# Patient Record
Sex: Male | Born: 1985 | Race: White | Hispanic: No | Marital: Single | Smoking: Former smoker
Health system: Southern US, Community
[De-identification: ages and names within clinical notes are randomized; demographics above are authoritative.]

## PROBLEM LIST (undated history)

## (undated) ENCOUNTER — Ambulatory Visit (HOSPITAL_COMMUNITY): Admission: EM | Disposition: A | Discharge status: Other Acute Inpatient Hospital | Payer: PRIVATE HEALTH INSURANCE

## (undated) DIAGNOSIS — I10 Essential (primary) hypertension: Secondary | ICD-10-CM

## (undated) DIAGNOSIS — J40 Bronchitis, not specified as acute or chronic: Secondary | ICD-10-CM

## (undated) HISTORY — PX: BACK SURGERY: SHX140

## (undated) HISTORY — PX: KNEE SURGERY: SHX244

---

## 2008-08-15 ENCOUNTER — Emergency Department: Payer: Self-pay | Admitting: Internal Medicine

## 2009-04-26 ENCOUNTER — Ambulatory Visit: Payer: Self-pay

## 2010-03-14 ENCOUNTER — Emergency Department (HOSPITAL_COMMUNITY): Admission: EM | Admit: 2010-03-14 | Discharge: 2010-03-15 | Payer: Self-pay | Admitting: Emergency Medicine

## 2010-08-04 ENCOUNTER — Emergency Department (HOSPITAL_COMMUNITY): Admission: EM | Admit: 2010-08-04 | Discharge: 2010-08-05 | Payer: Self-pay | Admitting: Emergency Medicine

## 2010-08-30 ENCOUNTER — Emergency Department (HOSPITAL_COMMUNITY): Admission: EM | Admit: 2010-08-30 | Discharge: 2010-05-30 | Payer: Self-pay | Admitting: Emergency Medicine

## 2010-09-24 ENCOUNTER — Ambulatory Visit: Payer: Self-pay | Admitting: Internal Medicine

## 2010-10-08 ENCOUNTER — Emergency Department (HOSPITAL_COMMUNITY)
Admission: EM | Admit: 2010-10-08 | Discharge: 2010-10-08 | Payer: Self-pay | Source: Home / Self Care | Admitting: Emergency Medicine

## 2010-10-13 ENCOUNTER — Ambulatory Visit: Payer: Self-pay | Admitting: Internal Medicine

## 2010-10-13 ENCOUNTER — Emergency Department: Payer: Self-pay | Admitting: Emergency Medicine

## 2010-10-16 ENCOUNTER — Emergency Department (HOSPITAL_COMMUNITY)
Admission: EM | Admit: 2010-10-16 | Discharge: 2010-10-17 | Payer: Self-pay | Source: Home / Self Care | Admitting: Emergency Medicine

## 2010-10-17 LAB — CBC
MCV: 90.9 fL (ref 78.0–100.0)
Platelets: 226 10*3/uL (ref 150–400)
RBC: 4.83 MIL/uL (ref 4.22–5.81)
RDW: 13 % (ref 11.5–15.5)
WBC: 6.9 10*3/uL (ref 4.0–10.5)

## 2010-10-17 LAB — POCT I-STAT, CHEM 8
Calcium, Ion: 1.07 mmol/L — ABNORMAL LOW (ref 1.12–1.32)
Glucose, Bld: 84 mg/dL (ref 70–99)
HCT: 45 % (ref 39.0–52.0)
Hemoglobin: 15.3 g/dL (ref 13.0–17.0)
TCO2: 27 mmol/L (ref 0–100)

## 2010-10-17 LAB — DIFFERENTIAL
Basophils Absolute: 0 10*3/uL (ref 0.0–0.1)
Basophils Relative: 1 % (ref 0–1)
Eosinophils Absolute: 0.3 10*3/uL (ref 0.0–0.7)
Eosinophils Relative: 4 % (ref 0–5)
Neutrophils Relative %: 44 % (ref 43–77)

## 2010-11-04 ENCOUNTER — Emergency Department (HOSPITAL_COMMUNITY): Payer: Medicare Other

## 2010-11-04 ENCOUNTER — Emergency Department (HOSPITAL_COMMUNITY)
Admission: EM | Admit: 2010-11-04 | Discharge: 2010-11-04 | Disposition: A | Discharge status: Home / Self Care | Payer: Medicare Other | Attending: Emergency Medicine | Admitting: Emergency Medicine

## 2010-11-04 DIAGNOSIS — R079 Chest pain, unspecified: Secondary | ICD-10-CM | POA: Insufficient documentation

## 2010-11-04 DIAGNOSIS — G8929 Other chronic pain: Secondary | ICD-10-CM | POA: Insufficient documentation

## 2010-11-04 DIAGNOSIS — M549 Dorsalgia, unspecified: Secondary | ICD-10-CM | POA: Insufficient documentation

## 2010-11-11 ENCOUNTER — Emergency Department (HOSPITAL_COMMUNITY)
Admission: EM | Admit: 2010-11-11 | Discharge: 2010-11-11 | Disposition: A | Discharge status: Home / Self Care | Payer: Medicare Other | Attending: Emergency Medicine | Admitting: Emergency Medicine

## 2010-11-11 ENCOUNTER — Emergency Department (HOSPITAL_COMMUNITY): Payer: Medicare Other

## 2010-11-11 DIAGNOSIS — M722 Plantar fascial fibromatosis: Secondary | ICD-10-CM | POA: Insufficient documentation

## 2010-11-11 DIAGNOSIS — M79609 Pain in unspecified limb: Secondary | ICD-10-CM | POA: Insufficient documentation

## 2010-11-12 ENCOUNTER — Emergency Department (HOSPITAL_COMMUNITY)
Admission: EM | Admit: 2010-11-12 | Discharge: 2010-11-12 | Disposition: A | Discharge status: Home / Self Care | Payer: Medicare Other | Attending: Emergency Medicine | Admitting: Emergency Medicine

## 2010-11-12 DIAGNOSIS — G8929 Other chronic pain: Secondary | ICD-10-CM | POA: Insufficient documentation

## 2010-11-12 DIAGNOSIS — M722 Plantar fascial fibromatosis: Secondary | ICD-10-CM | POA: Insufficient documentation

## 2010-11-12 DIAGNOSIS — M549 Dorsalgia, unspecified: Secondary | ICD-10-CM | POA: Insufficient documentation

## 2010-11-12 DIAGNOSIS — M79609 Pain in unspecified limb: Secondary | ICD-10-CM | POA: Insufficient documentation

## 2010-11-15 ENCOUNTER — Ambulatory Visit: Payer: Medicare Other

## 2010-11-22 ENCOUNTER — Emergency Department (HOSPITAL_COMMUNITY)
Admission: EM | Admit: 2010-11-22 | Discharge: 2010-11-23 | Disposition: A | Discharge status: Other Acute Inpatient Hospital | Payer: Medicare Other | Source: Home / Self Care | Attending: Emergency Medicine | Admitting: Emergency Medicine

## 2010-11-22 DIAGNOSIS — F1110 Opioid abuse, uncomplicated: Secondary | ICD-10-CM | POA: Insufficient documentation

## 2010-11-22 DIAGNOSIS — F329 Major depressive disorder, single episode, unspecified: Secondary | ICD-10-CM | POA: Insufficient documentation

## 2010-11-22 DIAGNOSIS — F3289 Other specified depressive episodes: Secondary | ICD-10-CM | POA: Insufficient documentation

## 2010-11-22 DIAGNOSIS — M79609 Pain in unspecified limb: Secondary | ICD-10-CM | POA: Insufficient documentation

## 2010-11-22 LAB — DIFFERENTIAL
Basophils Absolute: 0 10*3/uL (ref 0.0–0.1)
Basophils Relative: 0 % (ref 0–1)
Eosinophils Absolute: 0.3 K/uL (ref 0.0–0.7)
Eosinophils Relative: 2 % (ref 0–5)
Lymphocytes Relative: 26 % (ref 12–46)
Lymphs Abs: 3.2 K/uL (ref 0.7–4.0)
Monocytes Absolute: 1.3 K/uL — ABNORMAL HIGH (ref 0.1–1.0)
Monocytes Relative: 11 % (ref 3–12)
Neutro Abs: 7.7 10*3/uL (ref 1.7–7.7)
Neutrophils Relative %: 61 % (ref 43–77)

## 2010-11-22 LAB — COMPREHENSIVE METABOLIC PANEL
ALT: 25 U/L (ref 0–53)
Albumin: 4.3 g/dL (ref 3.5–5.2)
Alkaline Phosphatase: 56 U/L (ref 39–117)
Chloride: 103 mEq/L (ref 96–112)
Glucose, Bld: 92 mg/dL (ref 70–99)
Potassium: 3.5 mEq/L (ref 3.5–5.1)
Sodium: 138 mEq/L (ref 135–145)
Total Protein: 7.6 g/dL (ref 6.0–8.3)

## 2010-11-22 LAB — CBC
HCT: 45.9 % (ref 39.0–52.0)
Hemoglobin: 16.3 g/dL (ref 13.0–17.0)
MCH: 32 pg (ref 26.0–34.0)
MCHC: 35.5 g/dL (ref 30.0–36.0)
MCV: 90 fL (ref 78.0–100.0)
Platelets: 196 10*3/uL (ref 150–400)
RBC: 5.1 MIL/uL (ref 4.22–5.81)
RDW: 12.9 % (ref 11.5–15.5)
WBC: 12.6 10*3/uL — ABNORMAL HIGH (ref 4.0–10.5)

## 2010-11-22 LAB — ETHANOL: Alcohol, Ethyl (B): <5 mg/dL (ref 0–10)

## 2010-11-22 LAB — RAPID URINE DRUG SCREEN, HOSP PERFORMED
Amphetamines: NOT DETECTED
Barbiturates: NOT DETECTED
Benzodiazepines: NOT DETECTED
Cocaine: NOT DETECTED
Opiates: POSITIVE — AB
Tetrahydrocannabinol: NOT DETECTED

## 2010-11-22 LAB — COMPREHENSIVE METABOLIC PANEL WITH GFR
AST: 27 U/L (ref 0–37)
BUN: 18 mg/dL (ref 6–23)
CO2: 26 meq/L (ref 19–32)
Calcium: 9.2 mg/dL (ref 8.4–10.5)
Creatinine, Ser: 0.98 mg/dL (ref 0.4–1.5)
GFR calc Af Amer: >60 mL/min (ref >60)
GFR calc non Af Amer: >60 mL/min (ref >60)
Total Bilirubin: 1.1 mg/dL (ref 0.3–1.2)

## 2010-11-23 ENCOUNTER — Inpatient Hospital Stay (HOSPITAL_COMMUNITY)
Admission: AD | Admit: 2010-11-23 | Discharge: 2010-11-26 | DRG: 897 | Disposition: A | Discharge status: Home / Self Care | Payer: Medicare Other | Source: Ambulatory Visit | Attending: Psychiatry | Admitting: Psychiatry

## 2010-11-23 DIAGNOSIS — M549 Dorsalgia, unspecified: Secondary | ICD-10-CM

## 2010-11-23 DIAGNOSIS — Z6379 Other stressful life events affecting family and household: Secondary | ICD-10-CM

## 2010-11-23 DIAGNOSIS — F1120 Opioid dependence, uncomplicated: Principal | ICD-10-CM

## 2010-11-23 DIAGNOSIS — F329 Major depressive disorder, single episode, unspecified: Secondary | ICD-10-CM

## 2010-11-23 DIAGNOSIS — G8929 Other chronic pain: Secondary | ICD-10-CM

## 2010-11-23 DIAGNOSIS — F19939 Other psychoactive substance use, unspecified with withdrawal, unspecified: Secondary | ICD-10-CM

## 2010-11-23 DIAGNOSIS — K5900 Constipation, unspecified: Secondary | ICD-10-CM

## 2010-11-24 DIAGNOSIS — F1120 Opioid dependence, uncomplicated: Secondary | ICD-10-CM

## 2010-11-26 NOTE — H&P (Signed)
NAMESEBASTYAN, Cody Powell NO.:  [id number]  MEDICAL RECORD NO.:  [id number]           PATIENT TYPE:  I  LOCATION:  0306                          FACILITY:  [facility]  PHYSICIAN:  Anselm Jungling, MD  DATE OF BIRTH:  March 19, 1986  DATE OF ADMISSION:  11/23/2010 DATE OF DISCHARGE:                      PSYCHIATRIC ADMISSION ASSESSMENT   This is a voluntary admission to the services of Dr. Geralyn Flash.  This is 25 year old morbidly obese single white male.  He presented to the emergency department on March 1.  He stated that he needs to be detoxed from hydrocodone, oxycodone, and heroin.  His last use had been that morning.  His UDS was positive for opiates.  He had no measurable alcohol.  He has been using narcotics as well as IV heroin recently due to chronic back pain.  He is facing eviction.  He has a court date actually on Monday, which would be the 5th, regarding eviction.  He stated that he realizes that his girlfriend and 14-month-old son need him to work; hence, he presented for detox.  He has had withdrawal symptoms when off of drugs for about a day or day and a half.  He had abdominal cramps, sharp pain, nausea, and shaky.  He did shoot up into his left hand.  It did not go into the vein.  It was quite swollen up until yesterday.  It is improved, was still painful but not red.  Today it is a little bit red, but it does not appear to have cellulitis.  PAST PSYCHIATRIC HISTORY:  He began having treatment for substance abuse at age 62.  He has been to a facility [facility] in [place] 5 times.  He has been to [facility], [facility], and [facility].  SOCIAL HISTORY:  He has 1 year of college.  He has a 24-month-old son. He receives disability for his back and knee.  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  FAMILY HISTORY:  His brother had a substance abuse issue.  ALCOHOL/DRUG HISTORY:  He began using alcohol at age 23 or  25, hydrocodone at age 89, and heroin at age 78.  He currently has no primary care provider.  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  He underwent in December 2008 an L4-L5, L5-S1 diskectomy at [facility] in [place]. Two months ago, he underwent a left knee menisci.  Also his patellar was relocated and some work on the ACL and bone fragments were removed from his left knee.  This is at [facility].  CURRENTLY PRESCRIBED MEDICATIONS:  Flexeril 10 mg p.o. p.r.n. q.12h.  DRUG ALLERGIES:  NO KNOWN DRUG ALLERGIES.  Positive physical findings have already been commented on.  His urine was positive for opiates.  His alcohol level was less than 5. His WBC was slightly elevated at 12.6, and he had no abnormalities of his CMET.  PHYSICAL FINDINGS:  Vital signs showed that he is afebrile at 97.4 to 99.4.  His blood pressure ranged from 113/82 to 158/97.  Pulse was 76- 92, and respirations were 16-20. He is a large  young man.  His left hand is red, but he has full motion. There is no indication for cellulitis, just local irritation from reaction to where he missed the vein.  MENTAL STATUS EXAM:  He is alert and oriented.  He had good eye contact. He is casually groomed and dressed in hospital gowns.  His speech had a normal rate, rhythm, and tone.  His mood is appropriate to the situation.  Thought processes are clear, rational, and goal-oriented. Judgment and insight appear to be intact.  Concentration and memory are intact.  Intelligence is at least average.  PHYSICAL EXAMINATION:  AXIS I: 1. Opioid dependence, here for withdrawal. 2. Numerous prior psychiatric diagnoses by history, although he does     hook them to his drug use.  AXIS II:  Deferred.  AXIS III:  Chronic back pain, status post surgery for herniated disk.  AXIS IV:  Financial, occupational, relationship stressors.  AXIS V: 1.  The plan is to admit for a supported opiate detox through the  use of the clonidine detox protocol.  He denies any need for antidepressants, and he is asking [place] to help him get a letter to the court on Monday since he will not be there for his eviction hearing.  Estimated length of stay is until Tuesday, as he is hoping to be able to return to work on Wednesday March 7.     [name], P.A.-C.   ______________________________ Anselm Jungling, MD    MD/MEDQ  D:  11/24/2010  T:  11/24/2010  Job:  [id number]  Electronically Signed by Jaci Lazier ADAMS P.A.-C. on 11/25/2010 02:00:51 PM Electronically Signed by Geralyn Flash MD on 11/26/2010 11:06:46 AM

## 2010-12-04 LAB — COMPREHENSIVE METABOLIC PANEL
AST: 25 U/L (ref 0–37)
Albumin: 4.5 g/dL (ref 3.5–5.2)
Alkaline Phosphatase: 60 U/L (ref 39–117)
Chloride: 101 mEq/L (ref 96–112)
GFR calc Af Amer: >60 mL/min (ref >60)
Potassium: 3.9 mEq/L (ref 3.5–5.1)
Sodium: 135 mEq/L (ref 135–145)
Total Bilirubin: 0.7 mg/dL (ref 0.3–1.2)

## 2010-12-04 LAB — CBC
Platelets: 212 10*3/uL (ref 150–400)
RBC: 5.06 MIL/uL (ref 4.22–5.81)
WBC: 7.5 10*3/uL (ref 4.0–10.5)

## 2010-12-04 LAB — DIFFERENTIAL
Basophils Absolute: 0 10*3/uL (ref 0.0–0.1)
Basophils Relative: 0 % (ref 0–1)
Eosinophils Relative: 4 % (ref 0–5)
Lymphocytes Relative: 33 % (ref 12–46)
Monocytes Absolute: 0.5 10*3/uL (ref 0.1–1.0)

## 2010-12-12 NOTE — Discharge Summary (Signed)
  NAMEBREWER, HITCHMAN NO.:  [id number]  MEDICAL RECORD NO.:  [id number]           PATIENT TYPE:  I  LOCATION:  0306                          FACILITY:  [facility]  PHYSICIAN:  Anselm Jungling, MD  DATE OF BIRTH:  1985/10/17  DATE OF ADMISSION:  11/23/2010 DATE OF DISCHARGE:  11/26/2010                              DISCHARGE SUMMARY   IDENTIFYING DATA AND REASON FOR ADMISSION:  This was an inpatient psychiatric admission for Cody Powell, a 25 year old male who presented requesting detoxification from addiction to opiate pain medication and heroin.  Please refer to the admission note for further details pertaining to the symptoms, circumstances and history that led to his hospitalization.  He was given an initial Axis I diagnosis of opiate dependence.  MEDICAL AND LABORATORY:  The patient was medically and physically assessed by the psychiatric nurse practitioner.  He was in generally good health, without any active or chronic medical problems.  There were no significant medical issues during his stay.  HOSPITAL COURSE:  The patient was admitted to the adult inpatient psychiatric service.  He presented as a well-nourished, obese male who was clear and coherent in speech and thinking.  He denied suicidal ideation.  He verbalized a strong desire for help.  He was involved in therapeutic groups and activities including those geared towards 12-step recovery.  He was placed on the clonidine taper and standard opiate withdrawal protocol.  On the fourth hospital day, the patient indicated that he felt ready for discharge.  He stated that he needed to leave because of multiple issues to deal with regarding his job, housing, and legal.  He clearly and convincingly denied any suicidal ideation, plan or intent.  He agreed to the following aftercare plan.  AFTERCARE:  The patient was to follow up with Alcoholics Anonymous, attend 90 meetings in 90 days, and get a  sponsor.  DISCHARGE MEDICATIONS:  None.  DISCHARGE DIAGNOSES:  Axis I:  Opiate dependence, early remission. Axis II:  Deferred. Axis III:  No acute or chronic illnesses. Axis IV:  Stressors severe. Axis V:  GAF on discharge 50.     Anselm Jungling, MD     SPB/MEDQ  D:  12/12/2010  T:  12/12/2010  Job:  [id number]  Electronically Signed by Geralyn Flash MD on 12/12/2010 01:51:59 PM

## 2011-12-19 ENCOUNTER — Emergency Department (HOSPITAL_COMMUNITY)
Admission: EM | Admit: 2011-12-19 | Discharge: 2011-12-19 | Disposition: A | Discharge status: Home / Self Care | Payer: Medicare Other | Attending: Emergency Medicine | Admitting: Emergency Medicine

## 2011-12-19 ENCOUNTER — Emergency Department (HOSPITAL_COMMUNITY): Payer: Medicare Other

## 2011-12-19 ENCOUNTER — Encounter (HOSPITAL_COMMUNITY): Payer: Self-pay | Admitting: Emergency Medicine

## 2011-12-19 DIAGNOSIS — F17200 Nicotine dependence, unspecified, uncomplicated: Secondary | ICD-10-CM | POA: Insufficient documentation

## 2011-12-19 DIAGNOSIS — J3489 Other specified disorders of nose and nasal sinuses: Secondary | ICD-10-CM | POA: Insufficient documentation

## 2011-12-19 DIAGNOSIS — J029 Acute pharyngitis, unspecified: Secondary | ICD-10-CM | POA: Insufficient documentation

## 2011-12-19 DIAGNOSIS — J302 Other seasonal allergic rhinitis: Secondary | ICD-10-CM

## 2011-12-19 DIAGNOSIS — J309 Allergic rhinitis, unspecified: Secondary | ICD-10-CM | POA: Insufficient documentation

## 2011-12-19 DIAGNOSIS — R059 Cough, unspecified: Secondary | ICD-10-CM | POA: Insufficient documentation

## 2011-12-19 DIAGNOSIS — R05 Cough: Secondary | ICD-10-CM | POA: Insufficient documentation

## 2011-12-19 DIAGNOSIS — R6889 Other general symptoms and signs: Secondary | ICD-10-CM | POA: Insufficient documentation

## 2011-12-19 HISTORY — DX: Bronchitis, not specified as acute or chronic: J40

## 2011-12-19 MED ORDER — LORATADINE 10 MG PO TABS
10.0000 mg | ORAL_TABLET | Freq: Once | ORAL | Status: AC
  Administered 2011-12-19: 10 mg via ORAL
  Filled 2011-12-19 (×2): qty 1

## 2011-12-19 MED ORDER — ALBUTEROL SULFATE HFA 108 (90 BASE) MCG/ACT IN AERS: 2.0000 | INHALATION_SPRAY | RESPIRATORY_TRACT | Status: AC | PRN

## 2011-12-19 MED ORDER — ALBUTEROL SULFATE HFA 108 (90 BASE) MCG/ACT IN AERS
2.0000 | INHALATION_SPRAY | Freq: Once | RESPIRATORY_TRACT | Status: AC
  Administered 2011-12-19: 2 via RESPIRATORY_TRACT
  Filled 2011-12-19: qty 6.7

## 2011-12-19 MED ORDER — LORATADINE 10 MG PO TABS: 10.0000 mg | ORAL_TABLET | Freq: Once | ORAL | Status: AC

## 2011-12-19 NOTE — ED Notes (Signed)
Pt states he has had a cough for the past couple days and this evening he started having an aching in his chest but now it hurts when he coughs  Pt states his throat feels raw from all the coughing   Pt states he has been sick 3 times in the past couple months  Pt has dry hacking cough noted in triage

## 2011-12-19 NOTE — ED Provider Notes (Signed)
Medical screening examination/treatment/procedure(s) were performed by non-physician practitioner and as supervising physician I was immediately available for consultation/collaboration.  Jasmine Awe, MD 12/19/11 [phone]

## 2011-12-19 NOTE — ED Provider Notes (Signed)
History     CSN: [id number]  Arrival date & time 12/19/11  0020   First MD Initiated Contact with Patient 12/19/11 0221      Chief Complaint  Patient presents with  . Cough    (Consider location/radiation/quality/duration/timing/severity/associated sxs/prior treatment) HPI Comments: Patient here with a three day history of cough without sputum production, runny nose, nasal congestion, watery eyes, sneezing and sore throat - denies fever, chills, shortness of breath, wheezing, nausea, vomiting, chest pain.  Patient is a 26 y.o. male presenting with cough. The history is provided by the patient. No language interpreter was used.  Cough This is a new problem. The current episode started more than 2 days ago. The problem occurs constantly. The problem has not changed since onset.The cough is non-productive. There has been no fever. Associated symptoms include rhinorrhea, sore throat and eye redness. Pertinent negatives include no chest pain, no chills, no sweats, no weight loss, no ear congestion, no ear pain, no headaches, no myalgias, no shortness of breath and no wheezing. He has tried nothing for the symptoms. The treatment provided no relief. He is a smoker.    Past Medical History  Diagnosis Date  . Bronchitis     Past Surgical History  Procedure Date  . Back surgery     Family History  Problem Relation Age of Onset  . Cancer Other     History  Substance Use Topics  . Smoking status: Current Everyday Smoker    Types: Cigarettes  . Smokeless tobacco: Not on file  . Alcohol Use: No      Review of Systems  Constitutional: Negative for chills and weight loss.  HENT: Positive for sore throat and rhinorrhea. Negative for ear pain.   Eyes: Positive for redness.  Respiratory: Positive for cough. Negative for shortness of breath and wheezing.   Cardiovascular: Negative for chest pain.  Musculoskeletal: Negative for myalgias.  Neurological: Negative for headaches.  All  other systems reviewed and are negative.    Allergies  Review of patient's allergies indicates no known allergies.  Home Medications  No current outpatient prescriptions on file.  BP 130/73  Pulse 89  Temp(Src) 98.1 F (36.7 C) (Oral)  Resp 22  SpO2 96%  Physical Exam  Nursing note and vitals reviewed. Constitutional: He is oriented to person, place, and time. He appears well-developed and well-nourished. No distress.  HENT:  Head: Normocephalic and atraumatic.  Right Ear: External ear normal.  Left Ear: External ear normal.  Mouth/Throat: No oropharyngeal exudate.       Rhinorrhea and mild erythema in posterior oropharynx  Eyes: Conjunctivae are normal. Pupils are equal, round, and reactive to light. Right eye exhibits no discharge. Left eye exhibits no discharge. No scleral icterus.  Neck: Normal range of motion. Neck supple. No thyromegaly present.  Cardiovascular: Normal rate, regular rhythm and normal heart sounds.  Exam reveals no gallop and no friction rub.   No murmur heard. Pulmonary/Chest: Effort normal and breath sounds normal. No respiratory distress. He has no wheezes. He has no rales. He exhibits no tenderness.  Abdominal: Soft. Bowel sounds are normal. He exhibits no distension. There is no tenderness.  Musculoskeletal: Normal range of motion. He exhibits no edema and no tenderness.  Lymphadenopathy:    He has no cervical adenopathy.  Neurological: He is alert and oriented to person, place, and time. No cranial nerve deficit.  Skin: Skin is warm and dry. No rash noted. No erythema. No pallor.  Psychiatric: He has  a normal mood and affect. His behavior is normal. Judgment and thought content normal.    ED Course  Procedures (including critical care time)  Labs Reviewed - No data to display Dg Chest 2 View  12/19/2011  *RADIOLOGY REPORT*  Clinical Data: Cough for 3 days; chest pain.  CHEST - 2 VIEW  Comparison: Chest radiograph performed 11/04/2010  Findings:  The lungs are well-aerated and clear.  There is no evidence of focal opacification, pleural effusion or pneumothorax.  The heart is normal in size; the mediastinal contour is within normal limits.  No acute osseous abnormalities are seen.  IMPRESSION: No acute cardiopulmonary process seen.  Original Report Authenticated By: Tonia Ghent, M.D.     Seasonal allergies   MDM  Patient here with dry and hacking cough, watery eyes, runny nose without productive cough, though he is not wheezing, I have given him an inhaler here to help with the cough which I believe more to be related to the allergy symptoms and started him on claritin for this.        Izola Price [place], [place] 12/19/11 [phone]

## 2011-12-19 NOTE — Discharge Instructions (Signed)
Cough, Adult  A cough is a reflex. It helps you clear your throat and airways. A cough can help heal your body. A cough can last 2 or 3 weeks (acute) or may last more than 8 weeks (chronic). Some common causes of a cough can include an infection, allergy, or a cold. HOME CARE  Only take medicine as told by your doctor.   If given, take your medicines (antibiotics) as told. Finish them even if you start to feel better.   Use a cold steam vaporizer or humidier in your home. This can help loosen thick spit (secretions).   Sleep so you are almost sitting up (semi-upright). Use pillows to do this. This helps reduce coughing.   Rest as needed.   Stop smoking if you smoke.  GET HELP RIGHT AWAY IF:  You have yellowish-white fluid (pus) in your thick spit.   Your cough gets worse.   Your medicine does not reduce coughing, and you are losing sleep.   You cough up blood.   You have trouble breathing.   Your pain gets worse and medicine does not help.   You have a fever.  MAKE SURE YOU:   Understand these instructions.   Will watch your condition.   Will get help right away if you are not doing well or get worse.  Document Released: 05/23/2011 Document Revised: 08/29/2011 Document Reviewed: 05/23/2011 [facility] Patient Information 2012 [place], [place].Allergic Rhinitis Allergic rhinitis is when the mucous membranes in the nose respond to allergens. Allergens are particles in the air that cause your body to have an allergic reaction. This causes you to release allergic antibodies. Through a chain of events, these eventually cause you to release histamine into the blood stream (hence the use of antihistamines). Although meant to be protective to the body, it is this release that causes your discomfort, such as frequent sneezing, congestion and an itchy runny nose.  CAUSES  The pollen allergens may come from grasses, trees, and weeds. This is seasonal allergic rhinitis, or "hay fever." Other  allergens cause year-round allergic rhinitis (perennial allergic rhinitis) such as house dust mite allergen, pet dander and mold spores.  SYMPTOMS   Nasal stuffiness (congestion).   Runny, itchy nose with sneezing and tearing of the eyes.   There is often an itching of the mouth, eyes and ears.  It cannot be cured, but it can be controlled with medications. DIAGNOSIS  If you are unable to determine the offending allergen, skin or blood testing may find it. TREATMENT   Avoid the allergen.   Medications and allergy shots (immunotherapy) can help.   Hay fever may often be treated with antihistamines in pill or nasal spray forms. Antihistamines block the effects of histamine. There are over-the-counter medicines that may help with nasal congestion and swelling around the eyes. Check with your caregiver before taking or giving this medicine.  If the treatment above does not work, there are many new medications your caregiver can prescribe. Stronger medications may be used if initial measures are ineffective. Desensitizing injections can be used if medications and avoidance fails. Desensitization is when a patient is given ongoing shots until the body becomes less sensitive to the allergen. Make sure you follow up with your caregiver if problems continue. SEEK MEDICAL CARE IF:   You develop fever (more than 100.5 F (38.1 C).   You develop a cough that does not stop easily (persistent).   You have shortness of breath.   You start wheezing.  Symptoms interfere with normal daily activities.  Document Released: 06/04/2001 Document Revised: 08/29/2011 Document Reviewed: 12/14/2008 [facility] Patient Information 2012 [place], [place].

## 2012-03-03 IMAGING — CR DG THORACIC SPINE 2V
3 series · 3 of 3 positions shown · non-contrast
Comparison: Chest 11/04/2010.

CLINICAL DATA: Chest pain.

THORACIC SPINE - 2 VIEW

[t t-spine a.p.]
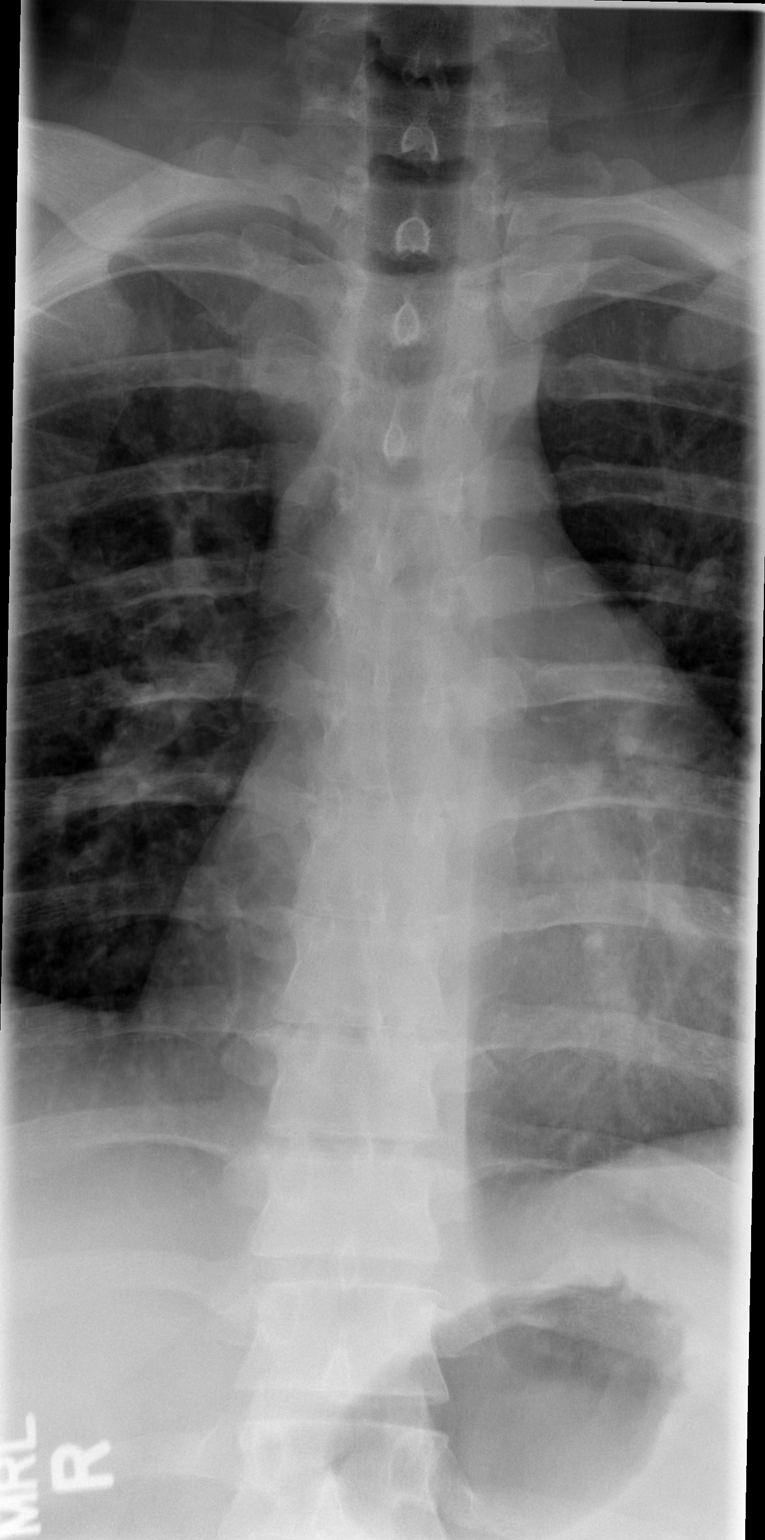

[t t-spine lat *]
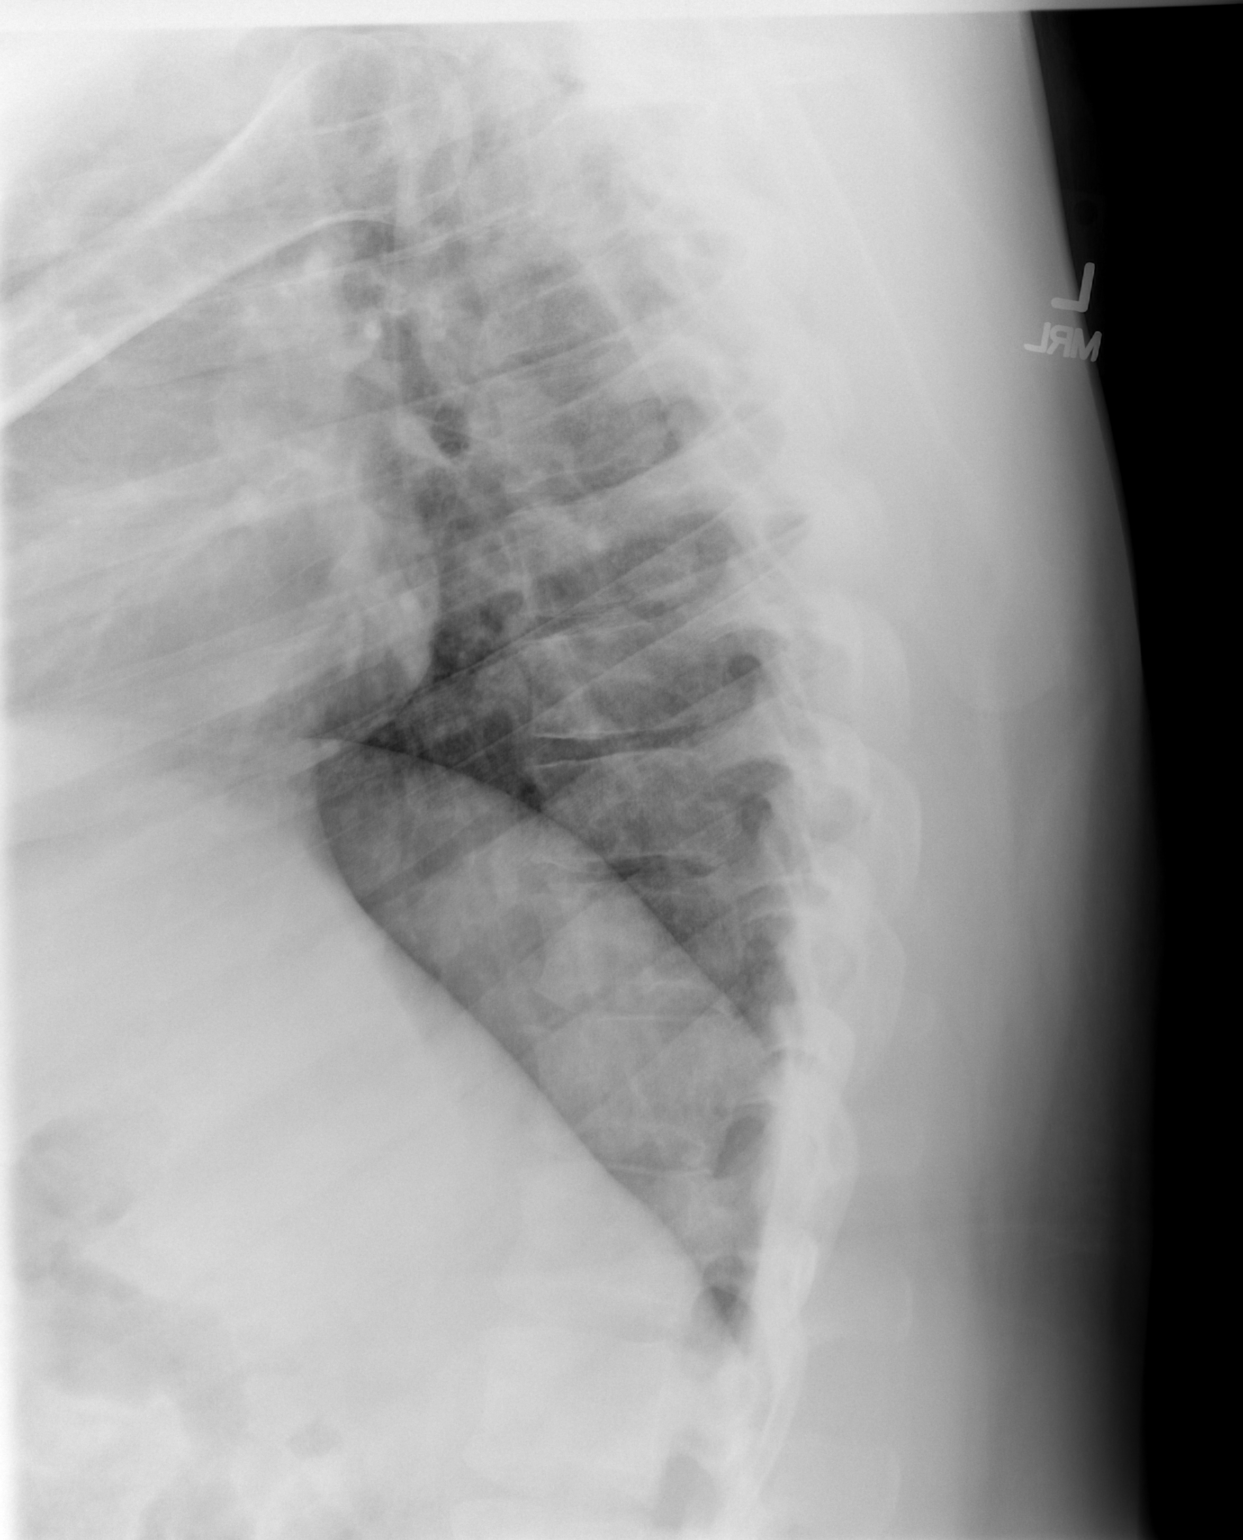

[t swimmers *]
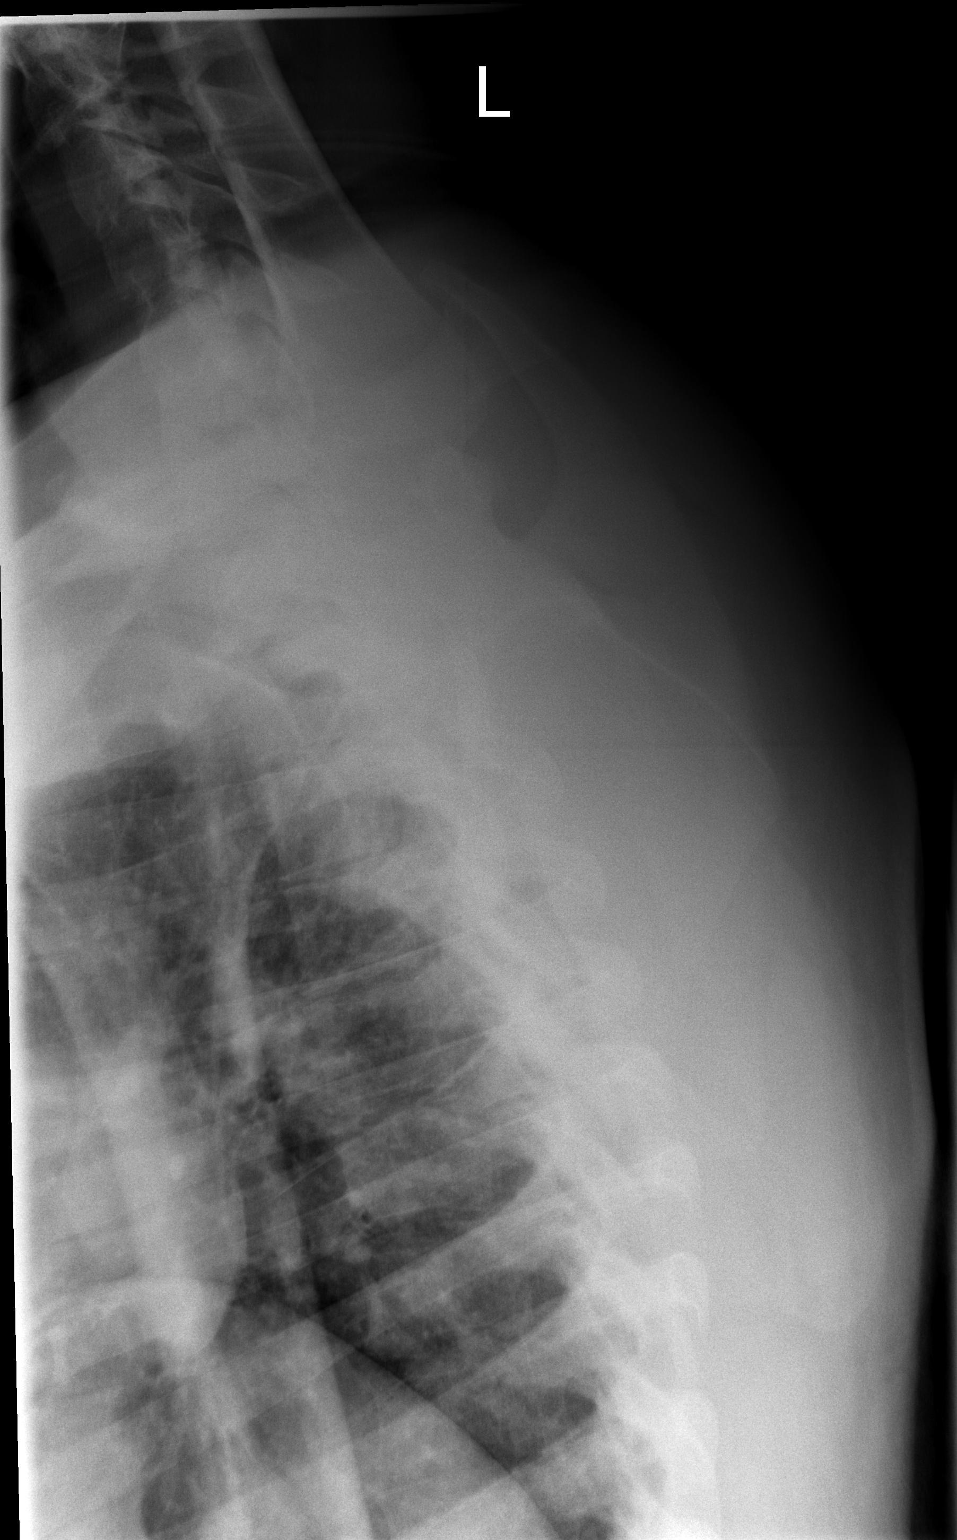

[3 of 3 positions shown; findings below may reference images not displayed]

FINDINGS: Vertebral body height and alignment are normal.  No
degenerative change.  Paraspinous structures unremarkable.
IMPRESSION: Negative exam.

## 2012-03-03 IMAGING — CR DG CHEST 2V
2 series · 2 of 2 positions shown · non-contrast
Comparison: Chest 08/04/2010.

CLINICAL DATA: Chest pain.

CHEST - 2 VIEW

[w chest pa]
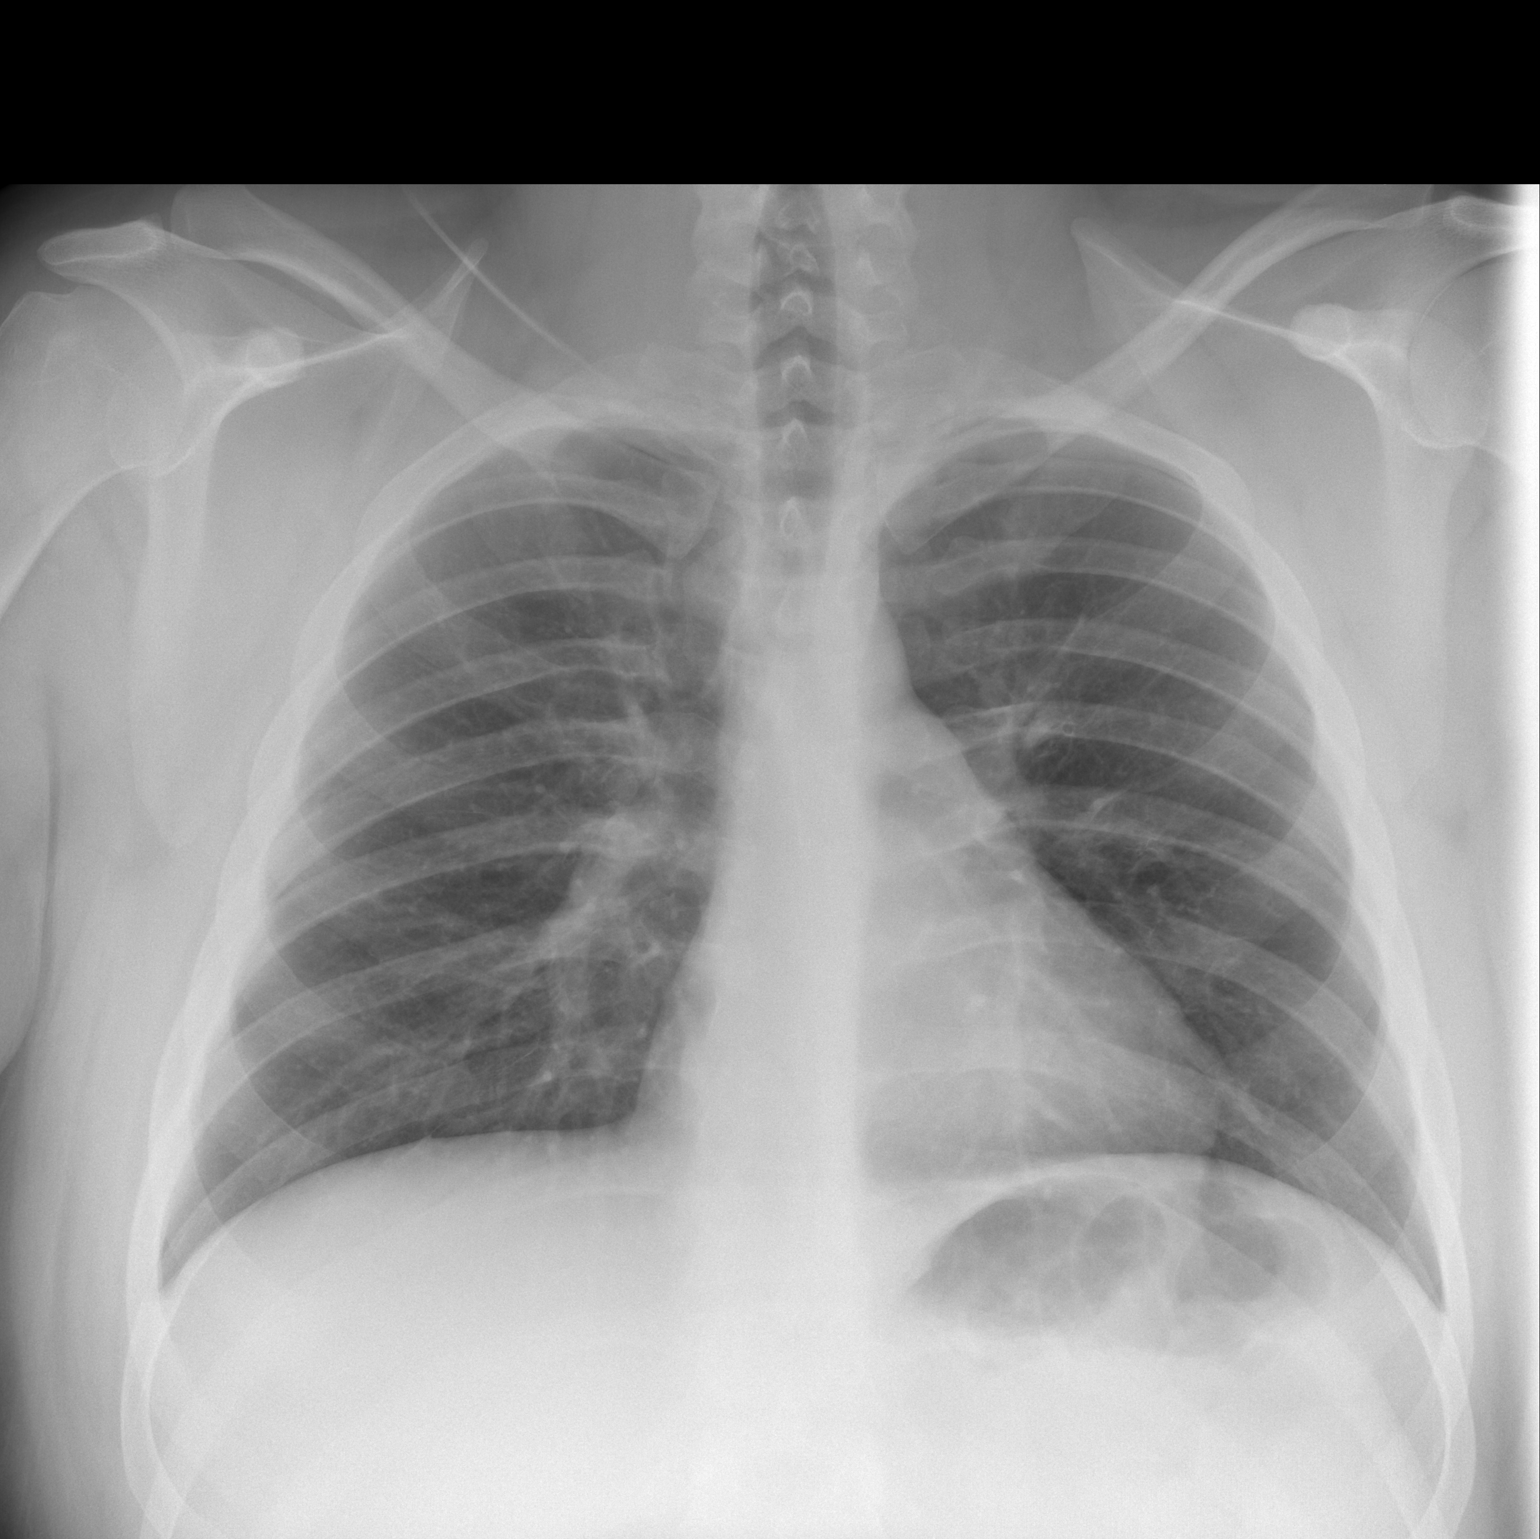

[w chest lat]
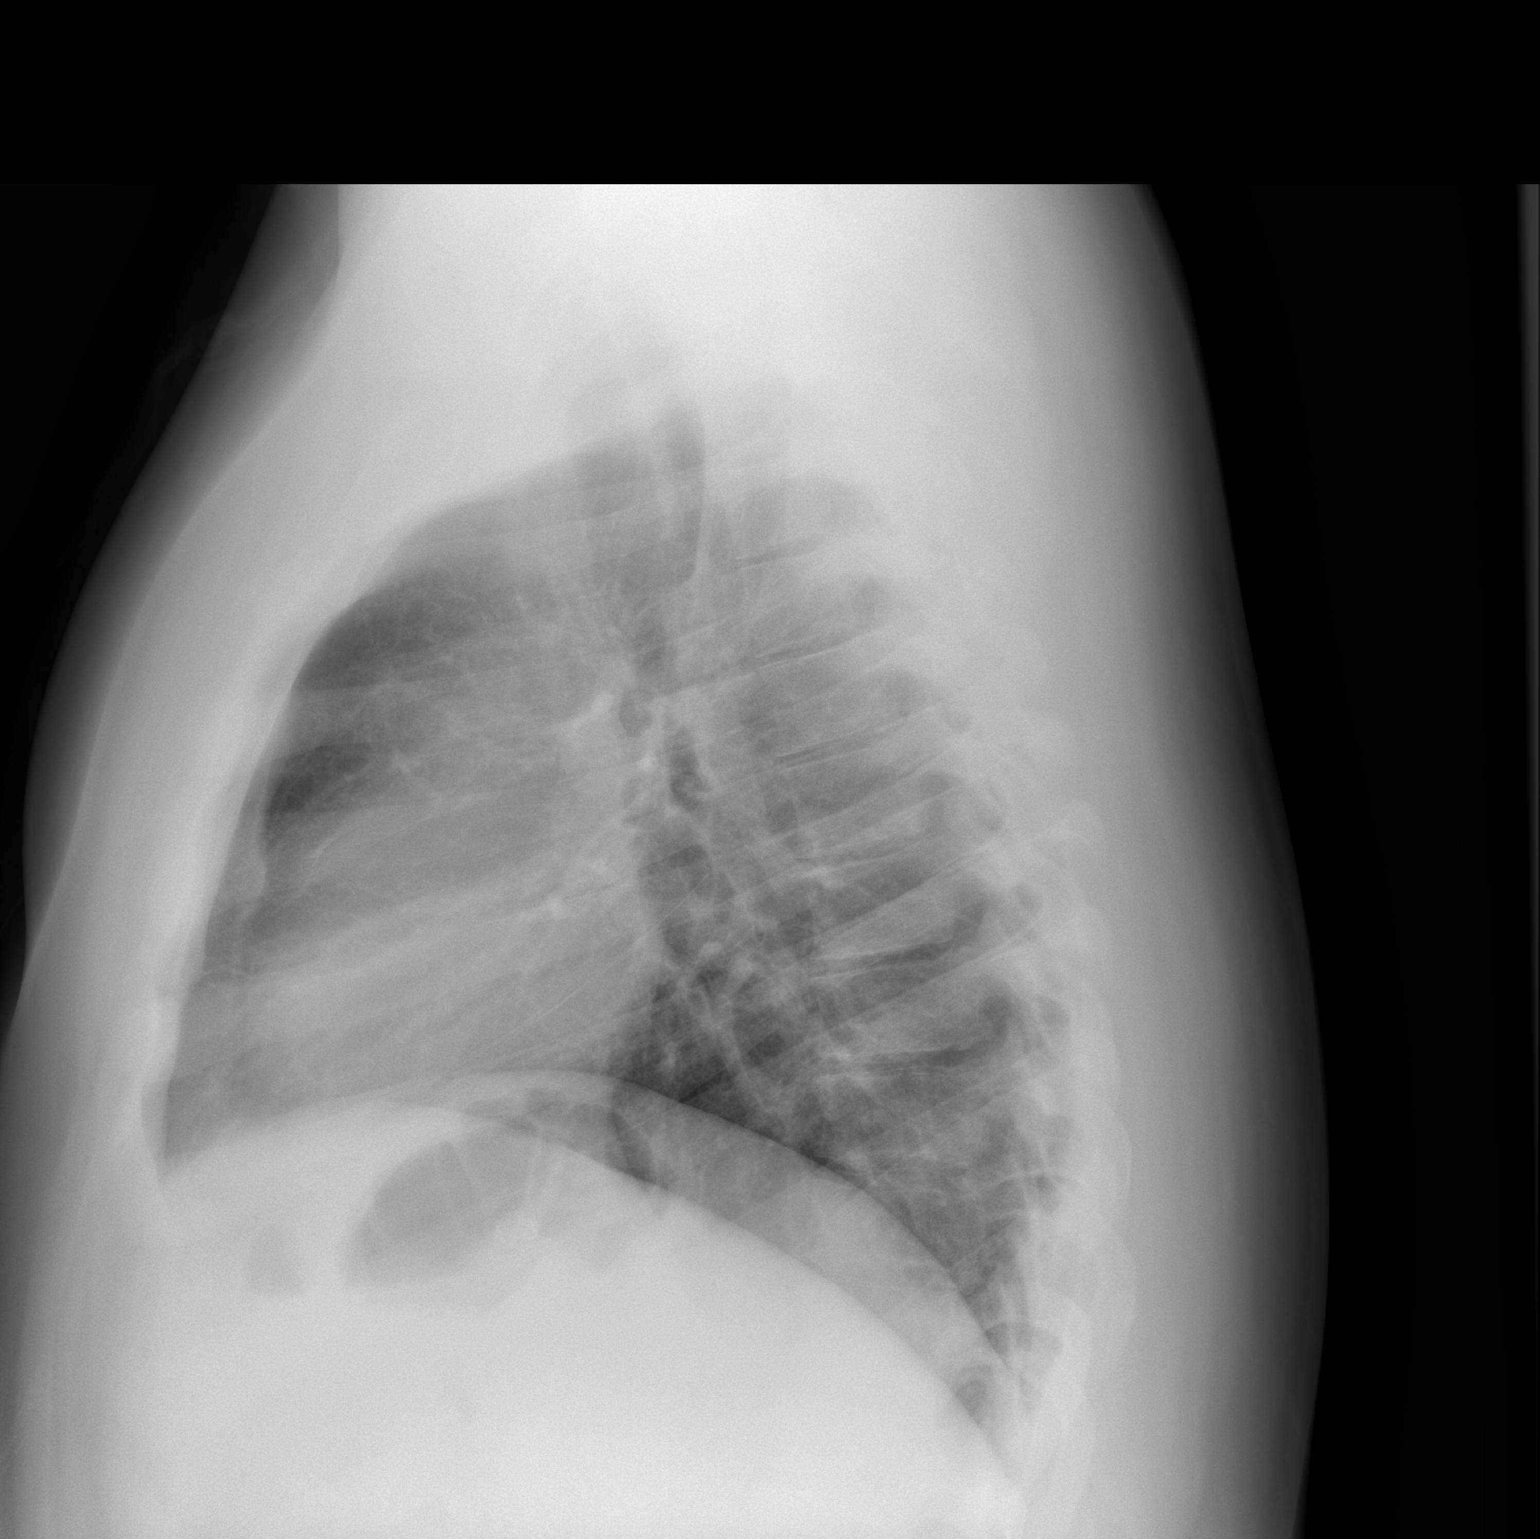

[2 of 2 positions shown; findings below may reference images not displayed]

FINDINGS: Lungs are clear.  No pneumothorax or pleural effusion.
Heart size normal.
IMPRESSION: Negative chest.

## 2013-09-26 ENCOUNTER — Inpatient Hospital Stay (HOSPITAL_COMMUNITY)
Admission: AD | Admit: 2013-09-26 | Discharge: 2013-09-29 | DRG: 897 | Disposition: A | Discharge status: Home / Self Care | Payer: Medicare Other | Source: Intra-hospital | Attending: Psychiatry | Admitting: Psychiatry

## 2013-09-26 ENCOUNTER — Emergency Department (EMERGENCY_DEPARTMENT_HOSPITAL)
Admission: EM | Admit: 2013-09-26 | Discharge: 2013-09-26 | Disposition: A | Discharge status: Other Acute Inpatient Hospital | Payer: Medicare Other | Source: Home / Self Care

## 2013-09-26 ENCOUNTER — Encounter (HOSPITAL_COMMUNITY): Payer: Self-pay

## 2013-09-26 ENCOUNTER — Encounter (HOSPITAL_COMMUNITY): Payer: Self-pay | Admitting: Emergency Medicine

## 2013-09-26 DIAGNOSIS — F3289 Other specified depressive episodes: Secondary | ICD-10-CM

## 2013-09-26 DIAGNOSIS — F1020 Alcohol dependence, uncomplicated: Secondary | ICD-10-CM | POA: Diagnosis present

## 2013-09-26 DIAGNOSIS — F1120 Opioid dependence, uncomplicated: Principal | ICD-10-CM | POA: Diagnosis present

## 2013-09-26 DIAGNOSIS — F1910 Other psychoactive substance abuse, uncomplicated: Secondary | ICD-10-CM

## 2013-09-26 DIAGNOSIS — F32A Depression, unspecified: Secondary | ICD-10-CM | POA: Diagnosis present

## 2013-09-26 DIAGNOSIS — F331 Major depressive disorder, recurrent, moderate: Secondary | ICD-10-CM | POA: Diagnosis present

## 2013-09-26 DIAGNOSIS — F329 Major depressive disorder, single episode, unspecified: Secondary | ICD-10-CM | POA: Diagnosis present

## 2013-09-26 DIAGNOSIS — F1010 Alcohol abuse, uncomplicated: Secondary | ICD-10-CM

## 2013-09-26 LAB — COMPREHENSIVE METABOLIC PANEL
ALT: 24 U/L (ref 0–53)
AST: 34 U/L (ref 0–37)
Albumin: 4.2 g/dL (ref 3.5–5.2)
Alkaline Phosphatase: 67 U/L (ref 39–117)
BILIRUBIN TOTAL: 0.4 mg/dL (ref 0.3–1.2)
BUN: 14 mg/dL (ref 6–23)
CHLORIDE: 99 meq/L (ref 96–112)
CO2: 23 mEq/L (ref 19–32)
CREATININE: 0.8 mg/dL (ref 0.50–1.35)
Calcium: 9.3 mg/dL (ref 8.4–10.5)
GFR calc Af Amer: >90 mL/min (ref >90)
Glucose, Bld: 108 mg/dL — ABNORMAL HIGH (ref 70–99)
Potassium: 3.8 mEq/L (ref 3.7–5.3)
Sodium: 137 mEq/L (ref 137–147)
Total Protein: 7.4 g/dL (ref 6.0–8.3)

## 2013-09-26 LAB — CBC
HEMATOCRIT: 42.9 % (ref 39.0–52.0)
Hemoglobin: 16 g/dL (ref 13.0–17.0)
MCH: 32.7 pg (ref 26.0–34.0)
MCHC: 37 g/dL — ABNORMAL HIGH (ref 30.0–36.0)
MCV: 87.7 fL (ref 78.0–100.0)
PLATELETS: 239 10*3/uL (ref 150–400)
RBC: 4.89 MIL/uL (ref 4.22–5.81)
RDW: 13.9 % (ref 11.5–15.5)
WBC: 8.2 10*3/uL (ref 4.0–10.5)

## 2013-09-26 LAB — RAPID URINE DRUG SCREEN, HOSP PERFORMED
AMPHETAMINES: NOT DETECTED
Barbiturates: NOT DETECTED
Benzodiazepines: NOT DETECTED
Cocaine: NOT DETECTED
Opiates: POSITIVE — AB
TETRAHYDROCANNABINOL: NOT DETECTED

## 2013-09-26 LAB — ACETAMINOPHEN LEVEL: Acetaminophen (Tylenol), Serum: <15 ug/mL (ref 10–30)

## 2013-09-26 LAB — SALICYLATE LEVEL

## 2013-09-26 LAB — ETHANOL: Alcohol, Ethyl (B): 161 mg/dL — ABNORMAL HIGH (ref 0–11)

## 2013-09-26 MED ORDER — LORAZEPAM 1 MG PO TABS
1.0000 mg | ORAL_TABLET | Freq: Three times a day (TID) | ORAL | Status: DC | PRN
  Administered 2013-09-26: 1 mg via ORAL
  Filled 2013-09-26: qty 1

## 2013-09-26 MED ORDER — LOPERAMIDE HCL 2 MG PO CAPS: 2.0000 mg | ORAL_CAPSULE | ORAL | Status: DC | PRN

## 2013-09-26 MED ORDER — ACETAMINOPHEN 325 MG PO TABS: 650.0000 mg | ORAL_TABLET | ORAL | Status: DC | PRN

## 2013-09-26 MED ORDER — IBUPROFEN 200 MG PO TABS: 600.0000 mg | ORAL_TABLET | Freq: Three times a day (TID) | ORAL | Status: DC | PRN

## 2013-09-26 MED ORDER — CLONIDINE HCL 0.1 MG PO TABS
0.1000 mg | ORAL_TABLET | ORAL | Status: DC
  Administered 2013-09-28 – 2013-09-29 (×2): 0.1 mg via ORAL
  Filled 2013-09-26 (×4): qty 1

## 2013-09-26 MED ORDER — ONDANSETRON 4 MG PO TBDP: 4.0000 mg | ORAL_TABLET | Freq: Four times a day (QID) | ORAL | Status: DC | PRN

## 2013-09-26 MED ORDER — THIAMINE HCL 100 MG/ML IJ SOLN: 100.0000 mg | Freq: Once | INTRAMUSCULAR | Status: DC

## 2013-09-26 MED ORDER — ZOLPIDEM TARTRATE 5 MG PO TABS: 5.0000 mg | ORAL_TABLET | Freq: Every evening | ORAL | Status: DC | PRN

## 2013-09-26 MED ORDER — ONDANSETRON HCL 4 MG PO TABS: 4.0000 mg | ORAL_TABLET | Freq: Three times a day (TID) | ORAL | Status: DC | PRN

## 2013-09-26 MED ORDER — CLONIDINE HCL 0.1 MG PO TABS: 0.1000 mg | ORAL_TABLET | Freq: Every day | ORAL | Status: DC

## 2013-09-26 MED ORDER — ALUM & MAG HYDROXIDE-SIMETH 200-200-20 MG/5ML PO SUSP: 30.0000 mL | ORAL | Status: DC | PRN

## 2013-09-26 MED ORDER — VITAMIN B-1 100 MG PO TABS: 100.0000 mg | ORAL_TABLET | Freq: Every day | ORAL | Status: DC

## 2013-09-26 MED ORDER — DICYCLOMINE HCL 20 MG PO TABS
20.0000 mg | ORAL_TABLET | Freq: Four times a day (QID) | ORAL | Status: DC | PRN
  Administered 2013-09-26: 20 mg via ORAL
  Filled 2013-09-26: qty 1

## 2013-09-26 MED ORDER — VITAMIN B-1 100 MG PO TABS
100.0000 mg | ORAL_TABLET | Freq: Every day | ORAL | Status: DC
  Administered 2013-09-27 – 2013-09-29 (×3): 100 mg via ORAL
  Filled 2013-09-26 (×5): qty 1

## 2013-09-26 MED ORDER — HYDROXYZINE HCL 25 MG PO TABS: 25.0000 mg | ORAL_TABLET | Freq: Four times a day (QID) | ORAL | Status: DC | PRN

## 2013-09-26 MED ORDER — METHOCARBAMOL 500 MG PO TABS: 500.0000 mg | ORAL_TABLET | Freq: Three times a day (TID) | ORAL | Status: DC | PRN

## 2013-09-26 MED ORDER — CHLORDIAZEPOXIDE HCL 25 MG PO CAPS: 25.0000 mg | ORAL_CAPSULE | Freq: Four times a day (QID) | ORAL | Status: DC | PRN

## 2013-09-26 MED ORDER — ADULT MULTIVITAMIN W/MINERALS CH
1.0000 | ORAL_TABLET | Freq: Every day | ORAL | Status: DC
  Administered 2013-09-26: 1 via ORAL
  Filled 2013-09-26: qty 1

## 2013-09-26 MED ORDER — METHOCARBAMOL 500 MG PO TABS
500.0000 mg | ORAL_TABLET | Freq: Three times a day (TID) | ORAL | Status: DC | PRN
  Administered 2013-09-26 – 2013-09-28 (×2): 500 mg via ORAL
  Filled 2013-09-26 (×2): qty 1

## 2013-09-26 MED ORDER — CLONIDINE HCL 0.1 MG PO TABS
0.1000 mg | ORAL_TABLET | Freq: Four times a day (QID) | ORAL | Status: AC
  Administered 2013-09-26 – 2013-09-28 (×5): 0.1 mg via ORAL
  Filled 2013-09-26 (×9): qty 1

## 2013-09-26 MED ORDER — ONDANSETRON 4 MG PO TBDP
4.0000 mg | ORAL_TABLET | Freq: Four times a day (QID) | ORAL | Status: DC | PRN
  Administered 2013-09-26: 4 mg via ORAL
  Filled 2013-09-26: qty 1

## 2013-09-26 MED ORDER — NICOTINE 21 MG/24HR TD PT24
21.0000 mg | MEDICATED_PATCH | Freq: Every day | TRANSDERMAL | Status: DC
  Administered 2013-09-27 – 2013-09-29 (×3): 21 mg via TRANSDERMAL
  Filled 2013-09-26 (×4): qty 1

## 2013-09-26 MED ORDER — DICYCLOMINE HCL 20 MG PO TABS: 20.0000 mg | ORAL_TABLET | Freq: Four times a day (QID) | ORAL | Status: DC | PRN

## 2013-09-26 MED ORDER — CHLORDIAZEPOXIDE HCL 25 MG PO CAPS
25.0000 mg | ORAL_CAPSULE | Freq: Four times a day (QID) | ORAL | Status: DC | PRN
  Administered 2013-09-26 – 2013-09-28 (×2): 25 mg via ORAL
  Filled 2013-09-26 (×2): qty 1

## 2013-09-26 MED ORDER — NICOTINE 21 MG/24HR TD PT24: 21.0000 mg | MEDICATED_PATCH | Freq: Every day | TRANSDERMAL | Status: DC

## 2013-09-26 MED ORDER — MAGNESIUM HYDROXIDE 400 MG/5ML PO SUSP: 30.0000 mL | Freq: Every day | ORAL | Status: DC | PRN

## 2013-09-26 MED ORDER — NAPROXEN 500 MG PO TABS: 500.0000 mg | ORAL_TABLET | Freq: Two times a day (BID) | ORAL | Status: DC | PRN

## 2013-09-26 MED ORDER — ADULT MULTIVITAMIN W/MINERALS CH
1.0000 | ORAL_TABLET | Freq: Every day | ORAL | Status: DC
  Administered 2013-09-27 – 2013-09-29 (×3): 1 via ORAL
  Filled 2013-09-26 (×5): qty 1

## 2013-09-26 MED ORDER — HYDROXYZINE HCL 25 MG PO TABS
25.0000 mg | ORAL_TABLET | Freq: Four times a day (QID) | ORAL | Status: DC | PRN
  Administered 2013-09-26 – 2013-09-28 (×2): 25 mg via ORAL
  Filled 2013-09-26 (×2): qty 1

## 2013-09-26 MED ORDER — CLONIDINE HCL 0.1 MG PO TABS: 0.1000 mg | ORAL_TABLET | ORAL | Status: DC

## 2013-09-26 MED ORDER — NAPROXEN 500 MG PO TABS
500.0000 mg | ORAL_TABLET | Freq: Two times a day (BID) | ORAL | Status: DC | PRN
  Administered 2013-09-28: 500 mg via ORAL
  Filled 2013-09-26 (×2): qty 1

## 2013-09-26 MED ORDER — CLONIDINE HCL 0.1 MG PO TABS
0.1000 mg | ORAL_TABLET | Freq: Four times a day (QID) | ORAL | Status: DC
  Administered 2013-09-26: 0.1 mg via ORAL
  Filled 2013-09-26: qty 1

## 2013-09-26 NOTE — Tx Team (Signed)
Initial Interdisciplinary Treatment Plan  PATIENT STRENGTHS: (choose at least two) Ability for insight Average or above average intelligence Capable of independent living Supportive family/friends  PATIENT STRESSORS: Financial difficulties Substance abuse   PROBLEM LIST: Problem List/Patient Goals Date to be addressed Date deferred Reason deferred Estimated date of resolution  Poly substance abuse      depression      Poor coping skills                                           DISCHARGE CRITERIA:  Ability to meet basic life and health needs Improved stabilization in mood, thinking, and/or behavior Motivation to continue treatment in a less acute level of care Verbal commitment to aftercare and medication compliance Withdrawal symptoms are absent or subacute and managed without 24-hour nursing intervention  PRELIMINARY DISCHARGE PLAN: Attend aftercare/continuing care group Attend 12-step recovery group Return to previous living arrangement  PATIENT/FAMIILY INVOLVEMENT: This treatment plan has been presented to and reviewed with the patient, Hennie DuosMartin Rapaport.  The patient and family have been given the opportunity to ask questions and make suggestions.  Heriberto Antiguaerry, Cecille Mcclusky M 09/26/2013, 5:11 PM
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
## 2013-09-26 NOTE — ED Notes (Signed)
Patient states that he was vomting blood earlier, he was also shooting up liquor and now his left hand is reddened.

## 2013-09-26 NOTE — Progress Notes (Signed)
Introduced self to patient and plan of care discussed.  Fluids offered.  Informed tech that pt could stay back for supper due to fatigue/withdrawal. Continue current POC and assessment of s/s of withdrawal.

## 2013-09-26 NOTE — ED Provider Notes (Signed)
CSN: [id number]     Arrival date & time 09/26/13  0302 History   First MD Initiated Contact with Patient 09/26/13 [phone]     Chief Complaint  Patient presents with  . Medical Clearance   (Consider location/radiation/quality/duration/timing/severity/associated sxs/prior Treatment) HPI Comments: Patient is a 28 year old male who presents for detox from alcohol and heroin. Patient is a difficulty historian and is obviously intoxicated. He reports using IV heroin tonight. He denies using alcohol tonight. Patient is wanting to detox from "everything" but is unable to tell me what else he is using besides alcohol and heroin. Patient cannot tell me how long he has been using heroin and alcohol. Patient denies suicidal or homicidal ideations.    Past Medical History  Diagnosis Date  . Bronchitis    Past Surgical History  Procedure Laterality Date  . Back surgery     Family History  Problem Relation Age of Onset  . Cancer Other    History  Substance Use Topics  . Smoking status: Current Every Day Smoker    Types: Cigarettes  . Smokeless tobacco: Not on file  . Alcohol Use: No    Review of Systems  Constitutional: Negative for fever, chills and fatigue.  HENT: Negative for trouble swallowing.   Eyes: Negative for visual disturbance.  Respiratory: Negative for shortness of breath.   Cardiovascular: Negative for chest pain and palpitations.  Gastrointestinal: Negative for nausea, vomiting, abdominal pain and diarrhea.  Genitourinary: Negative for dysuria and difficulty urinating.  Musculoskeletal: Negative for arthralgias and neck pain.  Skin: Negative for color change.  Neurological: Negative for dizziness and weakness.  Psychiatric/Behavioral: Negative for dysphoric mood.       Polysubstance abuse.     Allergies  Review of patient's allergies indicates no known allergies.  Home Medications   Current Outpatient Rx  Name  Route  Sig  Dispense  Refill  . acetaminophen (TYLENOL)  500 MG tablet   Oral   Take 1,000 mg by mouth every 6 (six) hours as needed (pain).          BP 121/85  Pulse 90  Temp(Src) 98 F (36.7 C) (Oral)  Resp 18  Ht 5\' 11"  (1.803 m)  Wt 272 lb 4 oz (123.492 kg)  BMI 37.99 kg/m2  SpO2 96% Physical Exam  Nursing note and vitals reviewed. Constitutional: He is oriented to person, place, and time. He appears well-developed and well-nourished. No distress.  Patient is intoxicated.   HENT:  Head: Normocephalic and atraumatic.  Eyes: Conjunctivae and EOM are normal.  Neck: Normal range of motion.  Cardiovascular: Normal rate and regular rhythm.  Exam reveals no gallop and no friction rub.   No murmur heard. Pulmonary/Chest: Effort normal and breath sounds normal. He has no wheezes. He has no rales. He exhibits no tenderness.  Abdominal: Soft. He exhibits no distension. There is no tenderness. There is no rebound and no guarding.  Musculoskeletal: Normal range of motion.  Neurological: He is alert and oriented to person, place, and time.  Slurred speech.    Skin: Skin is warm and dry.  Psychiatric: He has a normal mood and affect.    ED Course  Procedures (including critical care time) Labs Review Labs Reviewed  COMPREHENSIVE METABOLIC PANEL - Abnormal; Notable for the following:    Glucose, Bld 108 (*)    All other components within normal limits  ETHANOL - Abnormal; Notable for the following:    Alcohol, Ethyl (B) 161 (*)  All other components within normal limits  SALICYLATE LEVEL - Abnormal; Notable for the following:    Salicylate Lvl <2.0 (*)    All other components within normal limits  URINE RAPID DRUG SCREEN (HOSP PERFORMED) - Abnormal; Notable for the following:    Opiates POSITIVE (*)    All other components within normal limits  ACETAMINOPHEN LEVEL  CBC   Imaging Review No results found.  EKG Interpretation   None       MDM   1. Polysubstance abuse     4:55 AM Patient is requesting detox from  alcohol and heroin. Patient will be moved to the psych ED for further evaluation. Vitals stable and patient afebrile.   6:22 AM Patient is too intoxicated to be evaluated by TTS. Patient will be evaluated when sober.     Emilia BeckKaitlyn Remy Dia, [place]PA-C 09/26/13 [phone]

## 2013-09-26 NOTE — ED Notes (Signed)
Pt arrived to unit, no s/s of distress. Pt with c/o substance abuse and withdrawal symptoms. Pt pleasant and cooperative but endorses depression with dull, flat affect. Pt denies SI and verbally contracts for safety.

## 2013-09-26 NOTE — ED Notes (Signed)
patient is here for detox

## 2013-09-26 NOTE — Consult Note (Signed)
[facility] Face to Face Psychiatry Consult Subjective;  Patient is requesting detox from alcohol and from heroin.  Pt has been using continuously for three months and most of the time over the last two years. Pt reports he consumes a case of beer or a fifth of liquor per day; pt also reports shooting up heroin 3, 4 sometimes 6 times per week. Pt reports using various amounts but on average he believes he uses a gram per day. Patient states "I am tired of feeling this way.  I want to get my life together.  I don't like the way I feel, the way I look, and not having anything because of drugs."  Patient endorses depression but denies suicidal/homicidal ideation, psychosis, and paranoia.  Axis I: Alcohol Abuse, Depressive Disorder NOS, Mood Disorder NOS, Substance Abuse and Substance Induced Mood Disorder Axis II: Deferred Axis III:  Past Medical History  Diagnosis Date  . Bronchitis    Axis IV: occupational problems, other psychosocial or environmental problems and problems related to social environment Axis V: 41-50 serious symptoms   Psychiatric Specialty Exam: Physical Exam  ROS  Blood pressure 106/77, pulse 81, temperature 97.3 F (36.3 C), temperature source Oral, resp. rate 18, height 5\' 11"  (1.803 m), weight 123.492 kg (272 lb 4 oz), SpO2 95.00%.Body mass index is 37.99 kg/(m^2).  General Appearance: Disheveled  Eye SolicitorContact::  Fair  Speech:  Clear and Coherent and Normal Rate  Volume:  Normal  Mood:  Anxious, Depressed and Irritable  Affect:  Congruent and Depressed  Thought Process:  Circumstantial and Goal Directed  Orientation:  Full (Time, Place, and Person)  Thought Content:  "I need help"  Suicidal Thoughts:  No  Homicidal Thoughts:  No  Memory:  Immediate;   Good Recent;   Good  Judgement:  Poor  Insight:  Lacking  Psychomotor Activity:  Restlessness  Concentration:  Fair  Recall:  Good  Akathisia:  No  Handed:  Right  AIMS (if indicated):     Assets:  Communication  Skills Desire for Improvement  Sleep:      Face to face consult with Dr. Elsie SaasJonnalagadda  Disposition:  Inpatient treatment recommended. Start Clonidine protocol. [facility]  Monitor for safety and stabilization until [facility].  Shuvon B. Rankin FNP-BC  Patient is seen face to face for this evaluation with physician extender and reviewed the information documented by Physician extender and agree with the disposition plan.  Yaniah Thiemann,JANARDHAHA R. 09/27/2013 9:09 AM

## 2013-09-26 NOTE — ED Provider Notes (Signed)
Medical screening examination/treatment/procedure(s) were performed by non-physician practitioner and as supervising physician I was immediately available for consultation/collaboration.     Aleshka Corney M Lira Stephen, MD 09/26/13 0741 

## 2013-09-26 NOTE — ED Notes (Addendum)
Pt has 2 coats, shirt, underwear, jeans, socks, and shoes.     Luggage has clothing, cosmetics, and e-cigarette.  Has been seen and searched by security.

## 2013-09-26 NOTE — Progress Notes (Signed)
Psychoeducational Group Note  Date:  09/26/2013 Time:  2000  Group Topic/Focus:  AA group  Participation Level: Did Not Attend  Participation Quality:  Not Applicable  Affect:  Not Applicable  Cognitive:  Not Applicable  Insight:  Not Applicable  Engagement in Group: Not Applicable  Additional Comments:    Flonnie HailstoneCOOKE, Eliz Nigg R 09/26/2013, 10:01 PM

## 2013-09-26 NOTE — BH Assessment (Signed)
Assessment Note  Cody Powell is an 28 y.o. male.   What led to ER visit  Pt came to ED to be admitted and treated for detox from alcohol and from heroin.  Pt has been using continuously for three months and most of the time over the last two years.  Pt reports he consumes a case of beer or a fifth of liquor per day; pt also reports shooting up heroin 3, 4 sometimes 6 times per week.  Pt reports using various amounts but on average he believes he uses a gram per day.  Pt denies suicidal, homicidal and audio visual hallucinations currently and denies prior hx of.  Pt was admitted for detox in 2012.  See. Dr. Electa Sniff note from [facility].    When pt was asked why he came today to be be admitted for detox pt responded "My father, I don't know, I just need help, I want it.  I made a promise."  Pt could not elaborate.    MSE by TTS  Pt made poor eye contact, lethargic, sleepy, but awake, speech soft, recent recall poor, Ox3, disheveled, polite interaction.  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  No identified supports but did mention father over and over, but could not elaborate.  Depression  Pt reports he is depressed.  Pt requesting help to stop using drugs and improve quality of life.  Stressors impacting emotional health related to SA use.  Pt reports when he left [facility] in [facility] he did not work the aftercare plan.    Recommendation  Pt appears to be in need of detox.  CIWA is approximately an 8 and COWS is approximately 12.  Pt also has depression symptoms being withdrawn from others, isolated, more irritable, lacks self worth and does not enjoy things the way he used to.  Pt attributes this to SA.  Psychiatry will round on pt.  [facility] admit to 300 hall or 500 hall may be appropriate.  Pt willing to be admitted.  Axis I: Major  Depression, Recurrent severe, Substance Abuse and Substance Induced Mood Disorder Axis II: Deferred Axis III:  Past Medical History  Diagnosis Date  . Bronchitis    Axis IV: other psychosocial or environmental problems, problems related to social environment and problems with primary support group Axis V: 41-50 serious symptoms  Past Medical History:  Past Medical History  Diagnosis Date  . Bronchitis     Past Surgical History  Procedure Laterality Date  . Back surgery      Family History:  Family History  Problem Relation Age of Onset  . Cancer Other     Social History:  reports that he has been smoking Cigarettes.  He has been smoking about 0.00 packs per day. He does not have any smokeless tobacco history on file. He reports that he does not drink alcohol or use illicit drugs.  Additional Social History:  Alcohol / Drug Use Pain Medications: na Prescriptions: na Over the Counter: na History of alcohol / drug use?: Yes Longest period of sobriety (when/how long): 2 mths Negative Consequences of Use: Financial;Personal relationships;Work / Mining engineer #1 Name of Substance 1: alcohol 1 - Age of First Use: teen 1 - Amount (size/oz): case; fifth 1 - Frequency: daily 1 - Duration: 3 months 1 - Last Use / Amount: 09-25-13 Substance #  2 Name of Substance 2: Heroin 2 - Age of First Use: 21 2 - Amount (size/oz): varies; 1 to 2 grams 2 - Frequency: daily 2 - Duration: 2 months 2 - Last Use / Amount: 09-26-13  CIWA: CIWA-Ar BP: 114/56 mmHg Pulse Rate: 81 Nausea and Vomiting: mild nausea with no vomiting Tactile Disturbances: very mild itching, pins and needles, burning or numbness Tremor: not visible, but can be felt fingertip to fingertip Auditory Disturbances: not present Paroxysmal Sweats: no sweat visible Visual Disturbances: very mild sensitivity Anxiety: two Headache, Fullness in Head: very mild Agitation: somewhat more than normal activity Orientation and  Clouding of Sensorium: oriented and can do serial additions CIWA-Ar Total: 8 COWS: Clinical Opiate Withdrawal Scale (COWS) Resting Pulse Rate: Pulse Rate 81-100 Sweating: Subjective report of chills or flushing Restlessness: Frequent shifting or extraneous movements of legs/arms Pupil Size: Pupils pinned or normal size for room light Bone or Joint Aches: Mild diffuse discomfort Runny Nose or Tearing: Not present GI Upset: nausea or loose stool Tremor: Tremor can be felt, but not observed Yawning: Yawning once or twice during assessment Anxiety or Irritability: Patient obviously irritable/anxious Gooseflesh Skin: Skin is smooth COWS Total Score: 12  Allergies: No Known Allergies  Home Medications:  (Not in a hospital admission)  OB/GYN Status:  No LMP for male patient.  General Assessment Data Location of Assessment: [facility] ED Is this a Tele or Face-to-Face Assessment?: Face-to-Face Is this an Initial Assessment or a Re-assessment for this encounter?: Initial Assessment Living Arrangements: Alone Can pt return to current living arrangement?: Yes Admission Status: Voluntary Is patient capable of signing voluntary admission?: Yes Transfer from: Acute Hospital Referral Source: MD  Medical Screening Exam [facility] Walk-in ONLY) Medical Exam completed: Yes  [facility] Crisis Care Plan Living Arrangements: Alone  Education Status Is patient currently in school?: No Current Grade: na Highest grade of school patient has completed: na Name of school: na Contact person: na  Risk to self Suicidal Ideation: No Suicidal Intent: No Is patient at risk for suicide?: No Suicidal Plan?: No Access to Means: No What has been your use of drugs/alcohol within the last 12 months?: heroin and alcohol Previous Attempts/Gestures: No How many times?: 0 Other Self Harm Risks: na Triggers for Past Attempts: None known Intentional Self Injurious Behavior: None Family Suicide History: No Recent stressful  life event(s): Other (Comment) (na) Persecutory voices/beliefs?: No Depression: Yes Depression Symptoms: Feeling angry/irritable;Feeling worthless/self pity;Loss of interest in usual pleasures;Guilt;Fatigue;Isolating Substance abuse history and/or treatment for substance abuse?: Yes Suicide prevention information given to non-admitted patients: Not applicable  Risk to Others Homicidal Ideation: No Thoughts of Harm to Others: No Current Homicidal Intent: No Current Homicidal Plan: No Access to Homicidal Means: No Identified Victim: na History of harm to others?: No Assessment of Violence: None Noted Violent Behavior Description: cooperative Does patient have access to weapons?: No Criminal Charges Pending?: No Does patient have a court date: No  Psychosis Hallucinations: None noted Delusions: None noted  Mental Status Report Appear/Hygiene: Disheveled Eye Contact: Poor Motor Activity: Restlessness Speech: Logical/coherent;Soft Level of Consciousness: Quiet/awake;Restless;Irritable Mood: Depressed;Anxious;Sad Affect: Depressed;Anxious;Sad Anxiety Level: Moderate Thought Processes: Coherent Judgement: Impaired Orientation: Person;Time;Situation;Appropriate for developmental age Obsessive Compulsive Thoughts/Behaviors: None  Cognitive Functioning Concentration: Decreased Memory: Recent Impaired;Remote Intact IQ: Average Insight: Poor Impulse Control: Poor Appetite: Fair Weight Loss: 0 Weight Gain: 0 Sleep: Decreased Total Hours of Sleep: 2 Vegetative Symptoms: None  ADLScreening [facility] Assessment Services) Patient's cognitive ability adequate to safely complete daily activities?: Yes Patient  able to express need for assistance with ADLs?: Yes Independently performs ADLs?: Yes (appropriate for developmental age)  Prior Inpatient Therapy Prior Inpatient Therapy: Yes Prior Therapy Dates: 2012 [facility] Reason for Treatment: SA  Prior  Outpatient Therapy Prior Outpatient Therapy: No Prior Therapy Dates: na Prior Therapy Facilty/Provider(s): na Reason for Treatment: na  ADL Screening (condition at time of admission) Patient's cognitive ability adequate to safely complete daily activities?: Yes Is the patient deaf or have difficulty hearing?: No Does the patient have difficulty seeing, even when wearing glasses/contacts?: No Does the patient have difficulty concentrating, remembering, or making decisions?: No Patient able to express need for assistance with ADLs?: Yes Does the patient have difficulty dressing or bathing?: No Independently performs ADLs?: Yes (appropriate for developmental age) Does the patient have difficulty walking or climbing stairs?: No Weakness of Legs: None Weakness of Arms/Hands: None  Home Assistive Devices/Equipment Home Assistive Devices/Equipment: None  Therapy Consults (therapy consults require a physician order) PT Evaluation Needed: No OT Evalulation Needed: No SLP Evaluation Needed: No Abuse/Neglect Assessment (Assessment to be complete while patient is alone) Physical Abuse: Denies Verbal Abuse: Denies Sexual Abuse: Denies Exploitation of patient/patient's resources: Denies Self-Neglect: Denies Values / Beliefs Cultural Requests During Hospitalization: None Spiritual Requests During Hospitalization: None Consults Spiritual Care Consult Needed: No Social Work Consult Needed: No Merchant navy officerAdvance Directives (For Healthcare) Advance Directive: Patient does not have advance directive Pre-existing out of facility DNR order (yellow form or pink MOST form): No    Additional Information 1:1 In Past 12 Months?: No CIRT Risk: No Elopement Risk: No Does patient have medical clearance?: Yes     Disposition:  Disposition Initial Assessment Completed for this Encounter: Yes Disposition of Patient: Inpatient treatment program Type of inpatient treatment program: Adult  On Site  Evaluation by:   Reviewed with Physician:    Macon LargeLindsay Jr, Ellizabeth Dacruz Andrew 09/26/2013 8:31 AM

## 2013-09-26 NOTE — BH Assessment (Signed)
Contacted Dr. Norlene Campbelltter who reported she was with another pt currently and to contact nurse for updates. Roetta Sessionsontacted Kelean, RN who reported Pt was too intoxicated to participate in assessment at this time. RN will contact TTS when Pt is able to be assessed.   Yaakov Guthrieelilah Stewart, MSW, LCSW Triage Specialist [phone]

## 2013-09-27 DIAGNOSIS — F320 Major depressive disorder, single episode, mild: Secondary | ICD-10-CM

## 2013-09-27 DIAGNOSIS — F1120 Opioid dependence, uncomplicated: Principal | ICD-10-CM

## 2013-09-27 DIAGNOSIS — F1020 Alcohol dependence, uncomplicated: Secondary | ICD-10-CM
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
## 2013-09-27 NOTE — Progress Notes (Signed)
D: Pt has been in bed asleep, not attending groups. VSS.  A: Pt encouraged to get out of bed, fluids offered. R: Pt got up for meds, then back to sleep with covers over his head. Pt isolative to room. VSS

## 2013-09-27 NOTE — [facility] Group Notes (Signed)
[facility] LCSW Group Therapy  09/27/2013 3:45 PM  Type of Therapy:  Group Therapy  Participation Level:  None  Summary of Progress/Problems: Today's Topic: Overcoming Obstacles. Pt identified obstacles faced currently and processed barriers involved in overcoming these obstacles. Pt identified steps necessary for overcoming these obstacles and explored motivation (internal and external) for facing these difficulties head on. Pt further identified one area of concern in their lives and chose a skill of focus pulled from their "toolbox." Cody Powell slept throughout today's group and did not participate. At this time, he does not appear to be making progress in the group setting due to extreme drowsiness possibly caused by medications.    Smart, HeatherLCSWA  09/27/2013, 3:45 PM

## 2013-09-27 NOTE — Progress Notes (Signed)
Adult Psychoeducational Group Note  Date:  09/27/2013 Time:  2000  Group Topic/Focus:  AA  Participation Level:  Minimal  Participation Quality:  Appropriate  Affect:  Flat  Cognitive:  Alert and Appropriate  Insight: Improving  Engagement in Group:  Improving  Modes of Intervention:  Discussion and Education  Additional Comments:    Humberto SealsWhitaker, Caitlyne Ingham Monique 09/27/2013, 11:23 PM

## 2013-09-27 NOTE — [facility] Group Notes (Signed)
[facility]   09/27/2013 9:37 AM  Participation Quality:  DID NOT ATTEND -pt in bed asleep   Smart, Cody Powell LCSWA

## 2013-09-27 NOTE — [facility] Suicide Risk Assessment (Signed)
[facility] INPATIENT:  Family/Significant Other Suicide Prevention Education  Suicide Prevention Education:  Patient Refusal for Family/Significant Other Suicide Prevention Education: The patient Cody Powell has refused to provide written consent for family/significant other to be provided Family/Significant Other Suicide Prevention Education during admission and/or prior to discharge.  Physician notified.  SPE completed with pt. SPI pamphlet provided to pt and pt was encouraged to share with support network, ask questions, and talk about concerns.   Smart, Kadrian Partch LCSWA 09/27/2013, 2:47 PM

## 2013-09-27 NOTE — Progress Notes (Signed)
Adult Psychoeducational Group Note  Date:  09/27/2013 Time:  10:00AM Group Topic/Focus:  Wellness Toolbox:   The focus of this group is to discuss various aspects of wellness, balancing those aspects and exploring ways to increase the ability to experience wellness.  Patients will create a wellness toolbox for use upon discharge.  Participation Level:  Did Not Attend   Additional Comments: Pt. Didn't attend group.   Bing PlumeScott, Anab Vivar D 09/27/2013, 10:52 AM
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
## 2013-09-27 NOTE — Progress Notes (Signed)
Recreation Therapy Notes  Date: 01.05.2015 Time: 3:00pm Location: [address] Dayroom   Group Topic: Wellness  Goal Area(s) Addresses:  Patient will define components of whole wellness. Patient will verbalize benefit of whole wellness.  Behavioral Response: Appropriate  Intervention: Mind Map.  Activity: As a group patients were asked to identify and define the eight components of whole wellness - Physical, Mental, Emotional, Social, Environmental, Intellectual, Leisure, and Spiritual. Individually patients were asked to identify ways they are investing in each component of whole wellness.   Education: Wellness, Building control surveyorDischarge Planning, Coping Skills  Education Outcome: Acknowledges understanding   Clinical Observations/Feedback: Patient attended group session, but participated minimally. Patient completed worksheet as requested, but made no statements or contributions to group discussion.   Marykay Lexenise L Aynsley Fleet, LRT/CTRS  Eric Morganti L 09/27/2013 5:07 PM

## 2013-09-28 DIAGNOSIS — F1994 Other psychoactive substance use, unspecified with psychoactive substance-induced mood disorder: Secondary | ICD-10-CM

## 2013-09-28 DIAGNOSIS — F321 Major depressive disorder, single episode, moderate: Secondary | ICD-10-CM
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
## 2013-09-28 NOTE — Progress Notes (Signed)
[facility] MD Progress Note  09/28/2013 3:08 PM Cody Powell  MRN:  [id number] Subjective:  Cody Powell states that he is having a hard time maintaining abstinence. States that he has been able to pull up to 3 weeks not more than that. He states he relapses when he starts obsessing about using. States he keeps persistent cravings. Usually relapses on alcohol first and that opens the door to the opioids. He injects heroin. He states he injected Vodka and "it did not feel too good." feels his life is out of control. Diagnosis:   DSM5: Schizophrenia Disorders:  none Obsessive-Compulsive Disorders:  none Trauma-Stressor Disorders:  none Substance/Addictive Disorders:  Alcohol Related Disorder - Severe (303.90) and Opioid Disorder - Severe (304.00) Depressive Disorders:  Major Depressive Disorder - Moderate (296.22)  Axis I: Substance Induced Mood Disorder  ADL's:  Intact  Sleep: Poor  Appetite:  Fair  Suicidal Ideation:  Plan:  denies Intent:  denies Means:  denies Homicidal Ideation:  Plan:  denies Intent:  denies Means:  denies AEB (as evidenced by):  Psychiatric Specialty Exam: Review of Systems  Constitutional: Negative.   HENT: Negative.   Eyes: Negative.   Respiratory: Negative.   Cardiovascular: Negative.   Gastrointestinal: Negative.   Genitourinary: Negative.   Musculoskeletal: Negative.   Skin: Negative.   Neurological: Negative.   Endo/Heme/Allergies: Negative.   Psychiatric/Behavioral: Positive for depression and substance abuse. The patient is nervous/anxious.     Blood pressure 132/75, pulse 67, temperature 98.9 F (37.2 C), temperature source Oral, resp. rate 16, height 5\' 11"  (1.803 m), weight 121.564 kg (268 lb).Body mass index is 37.39 kg/(m^2).  General Appearance: Fairly Groomed  Patent attorney::  Fair  Speech:  Clear and Coherent  Volume:  Normal  Mood:  Anxious and worried  Affect:  anxious, worried  Thought Process:  Coherent and Goal Directed  Orientation:   Full (Time, Place, and Person)  Thought Content:  worries, concerns  Suicidal Thoughts:  No  Homicidal Thoughts:  No  Memory:  Immediate;   Fair Recent;   Fair Remote;   Fair  Judgement:  Fair  Insight:  Present  Psychomotor Activity:  Restlessness  Concentration:  Fair  Recall:  Fair  Akathisia:  No  Handed:    AIMS (if indicated):     Assets:  Desire for Improvement  Sleep:  Number of Hours: 4.5   Current Medications: Current Facility-Administered Medications  Medication Dose Route Frequency Provider Last Rate Last Dose  . acetaminophen (TYLENOL) tablet 650 mg  650 mg Oral Q4H PRN Shuvon Rankin, NP      . alum & mag hydroxide-simeth (MAALOX/MYLANTA) 200-200-20 MG/5ML suspension 30 mL  30 mL Oral PRN Shuvon Rankin, NP      . chlordiazePOXIDE (LIBRIUM) capsule 25 mg  25 mg Oral Q6H PRN Shuvon Rankin, NP   25 mg at 09/28/13 0438  . cloNIDine (CATAPRES) tablet 0.1 mg  0.1 mg Oral QID Shuvon Rankin, NP   0.1 mg at 09/28/13 1206   Followed by  . cloNIDine (CATAPRES) tablet 0.1 mg  0.1 mg Oral BH-qamhs Shuvon Rankin, NP       Followed by  . [START ON 10/01/2013] cloNIDine (CATAPRES) tablet 0.1 mg  0.1 mg Oral QAC breakfast Shuvon Rankin, NP      . dicyclomine (BENTYL) tablet 20 mg  20 mg Oral Q6H PRN Shuvon Rankin, NP   20 mg at 09/26/13 2217  . hydrOXYzine (ATARAX/VISTARIL) tablet 25 mg  25 mg Oral Q6H PRN Shuvon Rankin,  NP   25 mg at 09/26/13 2217  . loperamide (IMODIUM) capsule 2-4 mg  2-4 mg Oral PRN Shuvon Rankin, NP      . magnesium hydroxide (MILK OF MAGNESIA) suspension 30 mL  30 mL Oral Daily PRN Shuvon Rankin, NP      . methocarbamol (ROBAXIN) tablet 500 mg  500 mg Oral Q8H PRN Shuvon Rankin, NP   500 mg at 09/28/13 0438  . multivitamin with minerals tablet 1 tablet  1 tablet Oral Daily Shuvon Rankin, NP   1 tablet at 09/28/13 0812  . naproxen (NAPROSYN) tablet 500 mg  500 mg Oral BID PRN Shuvon Rankin, NP   500 mg at 09/28/13 0438  . nicotine (NICODERM CQ - dosed in mg/24  hours) patch 21 mg  21 mg Transdermal Daily Shuvon Rankin, NP   21 mg at 09/28/13 0813  . ondansetron (ZOFRAN) tablet 4 mg  4 mg Oral Q8H PRN Shuvon Rankin, NP      . ondansetron (ZOFRAN-ODT) disintegrating tablet 4 mg  4 mg Oral Q6H PRN Shuvon Rankin, NP   4 mg at 09/26/13 2217  . thiamine (B-1) injection 100 mg  100 mg Intramuscular Once Shuvon Rankin, NP      . thiamine (VITAMIN B-1) tablet 100 mg  100 mg Oral Daily Shuvon Rankin, NP   100 mg at 09/28/13 0813    Lab Results: No results found for this or any previous visit (from the past 48 hour(s)).  Physical Findings: AIMS: Facial and Oral Movements Muscles of Facial Expression: None, normal Lips and Perioral Area: None, normal Jaw: None, normal Tongue: None, normal,Extremity Movements Upper (arms, wrists, hands, fingers): None, normal Lower (legs, knees, ankles, toes): None, normal, Trunk Movements Neck, shoulders, hips: None, normal, Overall Severity Severity of abnormal movements (highest score from questions above): None, normal Incapacitation due to abnormal movements: None, normal Patient's awareness of abnormal movements (rate only patient's report): No Awareness, Dental Status Current problems with teeth and/or dentures?: No Does patient usually wear dentures?: No  CIWA:  CIWA-Ar Total: 6 COWS:  COWS Total Score: 6  Treatment Plan Summary: Daily contact with patient to assess and evaluate symptoms and progress in treatment Medication management  Plan: Supportive approach/coping skills/relapse prevention           Continue detox           Reassess co morbidities           Consider naltrexone/vivitrol  Medical Decision Making Problem Points:  Review of psycho-social stressors (1) Data Points:  Review of medication regiment & side effects (2)  I certify that inpatient services furnished can reasonably be expected to improve the patient's condition.   Kittie Krizan A 09/28/2013, 3:08 PM

## 2013-09-28 NOTE — Progress Notes (Signed)
D:  Per pt self inventory pt reports sleeping poor, appetite improving, energy level low, ability to pay attention good, rates depression at a 3 out of 10 and hopelessness at a 3 out of 10, denies SI/HI/AVH, c/o withdrawal s/s such as tremor, chills.      A:  Emotional support provided, Encouraged pt to continue with treatment plan and attend all group activities, q15 min checks maintained for safety.  R:  Pt has good insight, attending and participating in groups, calm and cooperative with staff and other patients.

## 2013-09-28 NOTE — Progress Notes (Signed)
Recreation Therapy Notes  Animal-Assisted Activity/Therapy (AAA/T) Program Checklist/Progress Notes Patient Eligibility Criteria Checklist & Daily Group note for Rec Tx Intervention  Date: 01.06.2015 Time: 2:45pm Location: 500 Hall Dayroom    AAA/T Program Assumption of Risk Form signed by Patient/ or Parent Legal Guardian yes  Patient is free of allergies or sever asthma yes  Patient reports no fear of animals yes  Patient reports no history of cruelty to animals yes   Patient understands his/her participation is voluntary yes  Patient washes hands before animal contact yes  Patient washes hands after animal contact yes  Behavioral Response: Appropriate, Attentive  Education: Hand Washing, Appropriate Animal Interaction   Education Outcome: Acknowledges understanding   Clinical Observations/Feedback: Patient interacted appropriately with therapy dog team and peers.     Lynnlee Revels L Merary Garguilo, LRT/CTRS  Jasn Xia L 09/28/2013 4:50 PM 

## 2013-09-28 NOTE — Progress Notes (Signed)
D    Pt went to group but left halfway through it and went to his room took a shower and went to bed   He did not come get his hs medications   He is pleasant on approach and cooperative   He complained of poor sleep and minimal signs and symptoms of withdrawal A    Verbal support given   Medications administered and effectiveness monitored   Q 15 min checks R    Pt safe at present

## 2013-09-28 NOTE — Progress Notes (Signed)
Recreation Therapy Notes  INPATIENT RECREATION THERAPY ASSESSMENT  Patient Stressors: Family, Relationship, Death, School   Coping Skills: Arguments, Substance Abuse, Avoidance, Music  Leisure Interests:  Listening to Music, Counselling psychologistMovies, Playing a Building control surveyorMusical Instrument,  Social Activities, Sports, Table Games, Engineer, structuralTravel, DietitianVideo Games,   Personal Challenges: Decision-Making, Relationships, School Performances, Self-Esteem/Confidence, Substance Abuse, Trusting Others,   Patient indicated she does not have a physical disability that would prevent participating in recreation therapy group sessions.  Patient listed the following strengths: Endurance and Patience  Patient indicated he would like to change the following about himself: My way of thinking  Patient listed the following current recreation interests:      Bars, [place], Using    Hexion Specialty ChemicalsDenise L Colleen Kotlarz, LRT/CTRS  Jearl KlinefelterBlanchfield, Bill Yohn L 09/28/2013 9:40 AM

## 2013-09-28 NOTE — [facility] Group Notes (Signed)
Adult Psychoeducational Group Note  Date:  09/28/2013 Time:  0900  Group Topic/Focus:  Orientation:   The focus of this group is to educate the patient on the purpose and policies of crisis stabilization and provide a format to answer questions about their admission.  The group details unit policies and expectations of patients while admitted.  Participation Level:  Active  Participation Quality:  Appropriate, Attentive, Sharing and Supportive  Affect:  Appropriate and Flat  Cognitive:  Alert, Appropriate and Oriented  Insight: Appropriate and Good  Engagement in Group:  Engaged  Modes of Intervention:  Discussion, Orientation and Support  Additional Comments:  Pt actively participated in group, set a goal for today" to be patient with this process and do some soul searching" while he is here.  Alfonse Sprucehorne, Gracyn Santillanes Brooke 09/28/2013, 2:13 PM
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
## 2013-09-28 NOTE — Clinical Social Work Note (Signed)
Pt given [facility] list per request. Adelina MingsKelsey from [facility] called stating that pt must be taking psych meds in order to be considered. Pt currently refusing meds. Pt notifed/MD notified.   The Sherwin-WilliamsHeather Smart, LCSWA 09/28/2013 11:27 AM

## 2013-09-28 NOTE — Progress Notes (Signed)
D: Pt denies SI/HI/AVH. Pt is pleasant and cooperative. [facility] but could not contact them tonight.   A: Pt was offered support and encouragement. Pt was given scheduled medications. Pt was encourage to attend groups. Q 15 minute checks were done for safety.   R:Pt attends groups and interacts well with peers and staff. Pt is taking medication. Pt has no complaints at this time.Pt receptive to treatment and safety maintained on unit.

## 2013-09-28 NOTE — Progress Notes (Signed)
Adult Psychoeducational Group Note  Date:  09/28/2013 Time:  2000  Group Topic/Focus:  Wrap-Up Group:   The focus of this group is to help patients review their daily goal of treatment and discuss progress on daily workbooks.  Participation Level:  Minimal  Participation Quality:  Appropriate and Drowsy  Affect:  Blunted and Flat  Cognitive:  Appropriate  Insight: Good  Engagement in Group:  Limited  Modes of Intervention:  Education, Exploration and Support  Additional Comments:  Pt shared his plans for discharge.  Humberto SealsWhitaker, Etienne Millward Monique 09/28/2013, 9:41 PM

## 2013-09-29 DIAGNOSIS — F331 Major depressive disorder, recurrent, moderate: Secondary | ICD-10-CM

## 2013-09-29 LAB — HEPATITIS PANEL, ACUTE
HCV Ab: NEGATIVE
HEP B C IGM: NONREACTIVE
Hep A IgM: NONREACTIVE
Hepatitis B Surface Ag: NEGATIVE

## 2013-09-29 MED ORDER — ACETAMINOPHEN 500 MG PO TABS: 1000.0000 mg | ORAL_TABLET | Freq: Four times a day (QID) | ORAL | Status: DC | PRN

## 2013-09-29 NOTE — Progress Notes (Signed)
Patient ID: Cody Powell, male   DOB: 08/23/1986, 28 y.o.   MRN: [id number] He has been up and to groups interacting with peers and staff. Stated he was anxious about leaving. He did not fill out a self inventory today.
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
## 2013-09-29 NOTE — Discharge Summary (Signed)
Physician Discharge Summary Note  Patient:  Cody Powell is an 28 y.o., male MRN:  [id number] DOB:  Nov 03, 1985 Patient phone:  [phone] (home)  Patient address:   [address] [place] [address],   Date of Admission:  09/26/2013 Date of Discharge: 09/29/12  Reason for Admission: Alcohol/drug detox    Discharge Diagnoses: Principal Problem:   Opioid type dependence, continuous Active Problems:   MDD (major depressive disorder)   Alcohol dependence  Review of Systems  Constitutional: Negative.   HENT: Negative.   Eyes: Negative.   Respiratory: Negative.   Cardiovascular: Negative.   Gastrointestinal: Negative.   Musculoskeletal: Negative.   Skin: Negative.   Neurological: Negative.   Psychiatric/Behavioral: Positive for substance abuse (Alcohol/opioid dependence). Negative for depression, suicidal ideas, hallucinations and memory loss. The patient is nervous/anxious (Stabilized with medication prior to discharge). The patient does not have insomnia.    DSM5: Schizophrenia Disorders:  NA Obsessive-Compulsive Disorders:  NA Trauma-Stressor Disorders:  NA Substance/Addictive Disorders:  Alcohol Related Disorder - Severe (303.90) and Opioid Disorder - Severe (304.00) Depressive Disorders:  Major Depressive Disorder - Moderate (296.22)  Axis Diagnosis:   AXIS I:  Alcohol Related Disorder - Severe (303.90) and Opioid Disorder - Severe (304.00), Major depressive disorder, recurrent, modertae AXIS II:  Deferred AXIS III:   Past Medical History  Diagnosis Date  . Bronchitis    AXIS IV:  Polysubstance dependence, chronic AXIS V:  62  Level of Care:  OP  Hospital Course: This is a 28 year old Caucasian male. Admitted to [facility] from the [facility] ED with complaints of drug/alcohol intoxication requesting treatment. Patient reports, "My friend took me to the [facility] yesterday, then I was sent to this hospital from there. I needed to stop drugs. I'm tired of  getting high. I was not able to drink alcohol like I used to the other day. I keep throwing up after I drank. I was also using heroin, snort it, shoot it, on & off x 4 years. I have been drinking regularly and heavily since the age of 2. Alcohol and drugs make me feel good. I have been through treatment programs a couple of times in the past. But, I'm unable to remember the names of those treatment programs. My longest sobriety is a little over a year, and that was 10 years ago. I feel depressed and anxious, but I don't want to be on any depression medicines. I'm not suicidal".  Upon admission into this hospital, and after admission assessment/evaluation coupled with UDS/toxicology reports that indicated blood acohol level of  161 and (+) opiate , it was determined that Mr. O'Neil will need detoxification treatment protocols to restabilize his systems of drug/alcohol intoxications and to combat the withdrawal symptoms of these substances as well. He was then started on clonidine detoxification treatment protocols for his opiate detoxification and Librium Capsules 25 mg on a prn basis. He was also enrolled in group counseling sessions and activities where he was counseled, taught and learned coping skills that should help him after discharge to cope better with his symptoms and manage his substance abuse/dependency issues for a much sustained sobreity. He also was enrolled/participated in the AA/NA meetings being offered and held on this unit. He has or rather presented with no previously existing and or identifiable medical conditions that required treatment and or monitoring. He received Tylenol 650 mg on prn basis for mild pain management. He was monitored closely for any potential problems that may  arise as a result of and or during detoxification treatment. Patient tolerated his detoxification treatment without any significant adverse effects and or reactions reported and or presented.  Patient attended  treatment team meeting this am and met with the treatment team members. His reason for admission, present symptoms, substance abuse issues, response to treatment and discharge plans discussed. Patient endorsed that he is doing well and stable for discharge to pursue the next phase of his substance abuse treatment. It was then agreed upon between patient and the team that he will be discharged to follow-up care at the [facility] psychiatric clinic here in [place], [place]. He has been informed that this is a walk-in appointment between the hours of 08-09:00 am, Monday thru Friday.The address, time, date and contact information for this clinic provided for patient in writing.   Upon discharge, patient adamantly denies suicidal, homicidal ideations, auditory, visual hallucinations, delusional thoughts, paranoia and or withdrawal symptoms. Patient left [facility] with all personal belongings in no apparent distress. He received no discharge medications. Transportation per city bus. Bus pass provided for patient by [facility].  Consults:  psychiatry  Significant Diagnostic Studies:  labs: CBC with diff, CMP, UDS, Toxicology tests, U/A  Discharge Vitals:   Blood pressure 116/80, pulse 82, temperature 98.4 F (36.9 C), temperature source Oral, resp. rate 16, height '5\' 11"'  (1.803 m), weight 121.564 kg (268 lb). Body mass index is 37.39 kg/(m^2). Lab Results:   Results for orders placed during the hospital encounter of 09/26/13 (from the past 72 hour(s))  HEPATITIS PANEL, ACUTE     Status: None   Collection Time    09/28/13  7:16 PM      Result Value Range   Hepatitis B Surface Ag NEGATIVE  NEGATIVE   HCV Ab NEGATIVE  NEGATIVE   Hep A IgM NON REACTIVE  NON REACTIVE   Hep B C IgM NON REACTIVE  NON REACTIVE   Comment: (NOTE)     High levels of Hepatitis B Core IgM antibody are detectable     during the acute stage of Hepatitis B. This antibody is used     to differentiate current from past HBV infection.     Performed  at [facility]    Physical Findings: AIMS: Facial and Oral Movements Muscles of Facial Expression: None, normal Lips and Perioral Area: None, normal Jaw: None, normal Tongue: None, normal,Extremity Movements Upper (arms, wrists, hands, fingers): None, normal Lower (legs, knees, ankles, toes): None, normal, Trunk Movements Neck, shoulders, hips: None, normal, Overall Severity Severity of abnormal movements (highest score from questions above): None, normal Incapacitation due to abnormal movements: None, normal Patient's awareness of abnormal movements (rate only patient's report): No Awareness, Dental Status Current problems with teeth and/or dentures?: No Does patient usually wear dentures?: No  CIWA:  CIWA-Ar Total: 3 COWS:  COWS Total Score: 6  Psychiatric Specialty Exam: See Psychiatric Specialty Exam and Suicide Risk Assessment completed by Attending Physician prior to discharge.  Discharge destination:  Home  Is patient on multiple antipsychotic therapies at discharge:  No   Has Patient had three or more failed trials of antipsychotic monotherapy by history:  No  Recommended Plan for Multiple Antipsychotic Therapies: NA     Medication List       Indication   acetaminophen 500 MG tablet  Commonly known as:  TYLENOL  Take 2 tablets (1,000 mg total) by mouth every 6 (six) hours as needed (pain).   Indication:  Fever, Pain  Follow-up Information   Follow up with [facility]. (Walk in between 8am-9am Monday through Friday for hospital followup/medication management/assessment for therapy services. )    Contact information:   [address], [place] [address] Phone: [phone] Fax: [phone]     Follow-up recommendations:  Activity:  As tolerated Diet: As recommended by your primary care doctor. Keep all scheduled follow-up appointments as recommended. Continue to work your relapse prevention plan Comments: Take all your medications as  prescribed by your mental healthcare provider. Report any adverse effects and or reactions from your medicines to your outpatient provider promptly. Patient is instructed and cautioned to not engage in alcohol and or illegal drug use while on prescription medicines. In the event of worsening symptoms, patient is instructed to call the crisis hotline, 911 and or go to the nearest ED for appropriate evaluation and treatment of symptoms. Follow-up with your primary care provider for your other medical issues, concerns and or health care needs.    Total Discharge Time:  Greater than 30 minutes.  Signed: Encarnacion Slates, [place], FNP 09/30/2013, 2:59 PM Agree with assessment and plan Woodroe Chen. Sabra Heck, M.D.

## 2013-09-29 NOTE — Progress Notes (Signed)
[facility] Discharge [facility] :  Will you be returning to the same living situation after discharge: Yes,  friend's home until accepted [facility]. At discharge, do you have transportation home?:Yes,  bus pass in chart.  Do you have the ability to pay for your medications:Yes,  Medicare  Release of information consent forms completed and submitted to Medical Records by CSW.  Patient to Follow up at: Follow-up Information   Follow up with [facility]. (Walk in between 8am-9am Monday through Friday for hospital followup/medication management/assessment for therapy services. )    Contact information:   [address] [place], [place] [address] Phone: [phone] Fax: [phone]      Patient denies SI/HI:   Yes,  in group/self report.    Safety Planning and Suicide Prevention discussed:  Yes,  SPI completed with pt as he refused to consent to family contact. SPI pamphlet provided to pt and he was encouraged to share with his support network.   Smart, HeatherLCSWA  09/29/2013, 11:30 AM

## 2013-09-29 NOTE — BHH Suicide Risk Assessment (Signed)
Suicide Risk Assessment  Discharge Assessment     Demographic Factors:  Male and Caucasian  Mental Status Per Nursing Assessment::   On Admission:  NA  Current Mental Status by Physician: in full contact with reality. There are no suicidal ideas, plans or intent. His mood is euthymic, his affect is appropriate. There are no active S/S of withdrawal. He wants to be D/C today. He is going to go for an interview at a half way house. States he is going back to meetings, work with his sponsor, etc. He is committed to abstinence and follow up on outpatient basis with a mental health provider and doing so get a better understanding of any on going symptoms that would suggest co morbidities that need to be addressed with psychotropics   Loss Factors: NA  Historical Factors: NA  Risk Reduction Factors:   Living with another person, especially a relative and Positive social support  Continued Clinical Symptoms:  Depression:   Comorbid alcohol abuse/dependence Alcohol/Substance Abuse/Dependencies  Cognitive Features That Contribute To Risk:  Closed-mindedness Polarized thinking Thought constriction (tunnel vision)    Suicide Risk:  Minimal: No identifiable suicidal ideation.  Patients presenting with no risk factors but with morbid ruminations; may be classified as minimal risk based on the severity of the depressive symptoms  Discharge Diagnoses:   AXIS I:  Opioid Dependence, Alcohol Dependence, substance induced mood disorder AXIS II:  Deferred AXIS III:   Past Medical History  Diagnosis Date  . Bronchitis    AXIS IV:  other psychosocial or environmental problems AXIS V:  61-70 mild symptoms  Plan Of Care/Follow-up recommendations:  Activity:  as tolerated Diet:  regular Follow up [facility] Is patient on multiple antipsychotic therapies at discharge:  No   Has Patient had three or more failed trials of antipsychotic monotherapy by history:  No  Recommended Plan for Multiple  Antipsychotic Therapies: NA  Breslin Hemann A 09/29/2013, 12:47 PM

## 2013-09-29 NOTE — BHH Group Notes (Signed)
[facility]   09/29/2013 10:59 AM  Participation Quality:  Appropriate   Mood/Affect:  Appropriate  Depression Rating:  0  Anxiety Rating:  3  Thoughts of Suicide:  No Will you contract for safety?   NA  Current AVH:  No  Plan for Discharge/Comments:  Pt plans to d/c today and will go to 3-4 [facility] in the [place] area. He plans [facility] for med management if needed/groups/assessment for therapy. He also plans to attend Mental Health Association groups and NA/AA meetings.   Transportation Means: bus pass in chart   Supports: limited family supports   Smart, [place] Santa FeHeatherLCSWA

## 2013-10-04 NOTE — Progress Notes (Signed)
Patient Discharge Instructions:  After Visit Summary (AVS):   Faxed to:  10/04/13 Discharge Summary Note:   Faxed to:  10/04/13 Psychiatric Admission Assessment Note:   Faxed to:  10/04/13 Suicide Risk Assessment - Discharge Assessment:   Faxed to:  10/04/13 Faxed/Sent to the Next Level Care provider:  10/04/13 Faxed to [facility] @ [phone]  Jerelene ReddenSheena E [place], 10/04/2013, 3:47 PM

## 2013-12-25 ENCOUNTER — Encounter (HOSPITAL_COMMUNITY): Payer: Self-pay | Admitting: Emergency Medicine

## 2013-12-25 ENCOUNTER — Emergency Department (HOSPITAL_COMMUNITY)
Admission: EM | Admit: 2013-12-25 | Discharge: 2013-12-26 | Disposition: A | Discharge status: Other Acute Inpatient Hospital | Payer: Medicare Other | Source: Home / Self Care | Attending: Emergency Medicine | Admitting: Emergency Medicine

## 2013-12-25 DIAGNOSIS — F1910 Other psychoactive substance abuse, uncomplicated: Secondary | ICD-10-CM

## 2013-12-25 LAB — COMPREHENSIVE METABOLIC PANEL
ALT: 23 U/L (ref 0–53)
AST: 26 U/L (ref 0–37)
Albumin: 4.3 g/dL (ref 3.5–5.2)
Alkaline Phosphatase: 61 U/L (ref 39–117)
BUN: 12 mg/dL (ref 6–23)
CALCIUM: 9.3 mg/dL (ref 8.4–10.5)
CO2: 24 meq/L (ref 19–32)
Chloride: 102 mEq/L (ref 96–112)
Creatinine, Ser: 0.77 mg/dL (ref 0.50–1.35)
GFR calc Af Amer: >90 mL/min (ref >90)
Glucose, Bld: 71 mg/dL (ref 70–99)
Potassium: 3.7 mEq/L (ref 3.7–5.3)
Sodium: 144 mEq/L (ref 137–147)
Total Bilirubin: 0.4 mg/dL (ref 0.3–1.2)
Total Protein: 7.6 g/dL (ref 6.0–8.3)

## 2013-12-25 LAB — CBC
HCT: 44.6 % (ref 39.0–52.0)
Hemoglobin: 16.7 g/dL (ref 13.0–17.0)
MCH: 32.8 pg (ref 26.0–34.0)
MCHC: 37.4 g/dL — ABNORMAL HIGH (ref 30.0–36.0)
MCV: 87.6 fL (ref 78.0–100.0)
Platelets: 245 10*3/uL (ref 150–400)
RBC: 5.09 MIL/uL (ref 4.22–5.81)
RDW: 13.3 % (ref 11.5–15.5)
WBC: 6.3 10*3/uL (ref 4.0–10.5)

## 2013-12-25 LAB — RAPID URINE DRUG SCREEN, HOSP PERFORMED
AMPHETAMINES: NOT DETECTED
Barbiturates: NOT DETECTED
Benzodiazepines: NOT DETECTED
COCAINE: NOT DETECTED
Opiates: POSITIVE — AB
TETRAHYDROCANNABINOL: NOT DETECTED

## 2013-12-25 LAB — SALICYLATE LEVEL: Salicylate Lvl: <2 mg/dL — ABNORMAL LOW (ref 2.8–20.0)

## 2013-12-25 LAB — ETHANOL: Alcohol, Ethyl (B): 82 mg/dL — ABNORMAL HIGH (ref 0–11)

## 2013-12-25 LAB — ACETAMINOPHEN LEVEL: Acetaminophen (Tylenol), Serum: <15 ug/mL (ref 10–30)

## 2013-12-25 MED ORDER — IBUPROFEN 200 MG PO TABS
600.0000 mg | ORAL_TABLET | Freq: Three times a day (TID) | ORAL | Status: DC | PRN
  Administered 2013-12-26: 600 mg via ORAL
  Filled 2013-12-25 (×2): qty 1

## 2013-12-25 NOTE — ED Notes (Signed)
Pt states that he was sober from ETOH and heroine for 90 days post detox at [facility]. Pt states that he relapsed on ETOH and heroine yesterday for the first time. Pt states that he would like to be detoxed again, states he is not sure of any resources which can help him or where he should go.

## 2013-12-25 NOTE — ED Notes (Signed)
The pt wants to be detoxed from alcohol and heroin.  His last heroin was yesterday.  His last alcohol was 2 hours ago

## 2013-12-25 NOTE — ED Provider Notes (Signed)
CSN: [id number]     Arrival date & time 12/25/13  1947 History   First MD Initiated Contact with Patient 12/25/13 2335     Chief Complaint  Patient presents with  . detox alcohol and heroin      (Consider location/radiation/quality/duration/timing/severity/associated sxs/prior Treatment) HPI Patient with history of heroin and alcohol abuse with relapse yesterday after being that it is clean presents for treatment. Patient states he drank roughly 12 beers yesterday and 6 beers today. He also used a gram of heroin yesterday. Currently he is mildly drowsy but denies any pain, nausea or vomiting. Patient denies any suicidal or homicidal ideation. He also denies any delusions or hallucinations. Past Medical History  Diagnosis Date  . Bronchitis    Past Surgical History  Procedure Laterality Date  . Back surgery     Family History  Problem Relation Age of Onset  . Cancer Other    History  Substance Use Topics  . Smoking status: Current Every Day Smoker -- 2.00 packs/day    Types: Cigarettes  . Smokeless tobacco: Not on file  . Alcohol Use: 7.2 oz/week    12 Cans of beer per week    Review of Systems  Constitutional: Negative for fever and chills.  Respiratory: Negative for shortness of breath.   Cardiovascular: Negative for chest pain.  Gastrointestinal: Negative for nausea, vomiting and abdominal pain.  Musculoskeletal: Negative for back pain.  Skin: Negative for rash.  Neurological: Negative for dizziness, weakness, numbness and headaches.  Psychiatric/Behavioral: Negative for suicidal ideas, hallucinations, confusion, self-injury, dysphoric mood and agitation. The patient is not nervous/anxious.   All other systems reviewed and are negative.      Allergies  Review of patient's allergies indicates no known allergies.  Home Medications  No current outpatient prescriptions on file. BP 114/66  Pulse 77  Temp(Src) 98 F (36.7 C) (Oral)  Resp 18  SpO2 99% Physical  Exam  Nursing note and vitals reviewed. Constitutional: He is oriented to person, place, and time. He appears well-developed and well-nourished. No distress.  HENT:  Head: Normocephalic and atraumatic.  Mouth/Throat: Oropharynx is clear and moist.  Eyes: EOM are normal. Pupils are equal, round, and reactive to light.  Neck: Normal range of motion. Neck supple.  Cardiovascular: Normal rate and regular rhythm.   Pulmonary/Chest: Effort normal and breath sounds normal. No respiratory distress. He has no wheezes. He has no rales.  Abdominal: Soft. Bowel sounds are normal. He exhibits no distension and no mass. There is no tenderness. There is no rebound and no guarding.  Musculoskeletal: Normal range of motion. He exhibits no edema and no tenderness.  Neurological: He is alert and oriented to person, place, and time.  Moves all extremities without deficit. Sensation grossly intact.  Skin: Skin is warm and dry. No rash noted. No erythema.  Psychiatric: He has a normal mood and affect. His behavior is normal.  Good insight. No SI/HI.    ED Course  Procedures (including critical care time) Labs Review Labs Reviewed  CBC - Abnormal; Notable for the following:    MCHC 37.4 (*)    All other components within normal limits  ETHANOL - Abnormal; Notable for the following:    Alcohol, Ethyl (B) 82 (*)    All other components within normal limits  SALICYLATE LEVEL - Abnormal; Notable for the following:    Salicylate Lvl <2.0 (*)    All other components within normal limits  URINE RAPID DRUG SCREEN (HOSP PERFORMED) - Abnormal;  Notable for the following:    Opiates POSITIVE (*)    All other components within normal limits  ACETAMINOPHEN LEVEL  COMPREHENSIVE METABOLIC PANEL   Imaging Review No results found.   EKG Interpretation None      MDM   Final diagnoses:  None    Patient is medically cleared for psychiatric evaluation and placement.    Loren Raceravid Mackena Plummer, MD 12/26/13 [phone]

## 2013-12-26 ENCOUNTER — Encounter (HOSPITAL_COMMUNITY): Payer: Self-pay

## 2013-12-26 ENCOUNTER — Inpatient Hospital Stay (HOSPITAL_COMMUNITY)
Admission: AD | Admit: 2013-12-26 | Discharge: 2013-12-27 | DRG: 885 | Disposition: A | Discharge status: Home / Self Care | Payer: Medicare Other | Source: Intra-hospital | Attending: Psychiatry | Admitting: Psychiatry

## 2013-12-26 DIAGNOSIS — F331 Major depressive disorder, recurrent, moderate: Secondary | ICD-10-CM | POA: Diagnosis present

## 2013-12-26 DIAGNOSIS — Z5900 Homelessness unspecified: Secondary | ICD-10-CM

## 2013-12-26 DIAGNOSIS — F1994 Other psychoactive substance use, unspecified with psychoactive substance-induced mood disorder: Secondary | ICD-10-CM | POA: Diagnosis present

## 2013-12-26 DIAGNOSIS — F17200 Nicotine dependence, unspecified, uncomplicated: Secondary | ICD-10-CM | POA: Diagnosis present

## 2013-12-26 DIAGNOSIS — F1110 Opioid abuse, uncomplicated: Secondary | ICD-10-CM

## 2013-12-26 DIAGNOSIS — F411 Generalized anxiety disorder: Secondary | ICD-10-CM | POA: Diagnosis present

## 2013-12-26 DIAGNOSIS — F1120 Opioid dependence, uncomplicated: Secondary | ICD-10-CM

## 2013-12-26 DIAGNOSIS — F1010 Alcohol abuse, uncomplicated: Secondary | ICD-10-CM

## 2013-12-26 DIAGNOSIS — F1020 Alcohol dependence, uncomplicated: Secondary | ICD-10-CM

## 2013-12-26 DIAGNOSIS — F332 Major depressive disorder, recurrent severe without psychotic features: Secondary | ICD-10-CM

## 2013-12-26 MED ORDER — HYDROXYZINE HCL 25 MG PO TABS: 25.0000 mg | ORAL_TABLET | Freq: Four times a day (QID) | ORAL | Status: DC | PRN

## 2013-12-26 MED ORDER — MAGNESIUM HYDROXIDE 400 MG/5ML PO SUSP: 30.0000 mL | Freq: Every day | ORAL | Status: DC | PRN

## 2013-12-26 MED ORDER — CLONIDINE HCL 0.1 MG PO TABS
0.1000 mg | ORAL_TABLET | ORAL | Status: DC
  Filled 2013-12-26 (×2): qty 1

## 2013-12-26 MED ORDER — CLONIDINE HCL 0.1 MG PO TABS
0.1000 mg | ORAL_TABLET | Freq: Four times a day (QID) | ORAL | Status: DC
  Filled 2013-12-26 (×10): qty 1

## 2013-12-26 MED ORDER — NICOTINE POLACRILEX 2 MG MT GUM
CHEWING_GUM | OROMUCOSAL | Status: AC
  Filled 2013-12-26: qty 1

## 2013-12-26 MED ORDER — ACETAMINOPHEN 325 MG PO TABS: 650.0000 mg | ORAL_TABLET | Freq: Four times a day (QID) | ORAL | Status: DC | PRN

## 2013-12-26 MED ORDER — LOPERAMIDE HCL 2 MG PO CAPS: 2.0000 mg | ORAL_CAPSULE | ORAL | Status: DC | PRN

## 2013-12-26 MED ORDER — DICYCLOMINE HCL 20 MG PO TABS: 20.0000 mg | ORAL_TABLET | Freq: Four times a day (QID) | ORAL | Status: DC | PRN

## 2013-12-26 MED ORDER — ALUM & MAG HYDROXIDE-SIMETH 200-200-20 MG/5ML PO SUSP: 30.0000 mL | ORAL | Status: DC | PRN

## 2013-12-26 MED ORDER — METHOCARBAMOL 500 MG PO TABS: 500.0000 mg | ORAL_TABLET | Freq: Three times a day (TID) | ORAL | Status: DC | PRN

## 2013-12-26 MED ORDER — CHLORDIAZEPOXIDE HCL 25 MG PO CAPS: 25.0000 mg | ORAL_CAPSULE | Freq: Four times a day (QID) | ORAL | Status: DC | PRN

## 2013-12-26 MED ORDER — NAPROXEN 500 MG PO TABS: 500.0000 mg | ORAL_TABLET | Freq: Two times a day (BID) | ORAL | Status: DC | PRN

## 2013-12-26 MED ORDER — ONDANSETRON 4 MG PO TBDP: 4.0000 mg | ORAL_TABLET | Freq: Four times a day (QID) | ORAL | Status: DC | PRN

## 2013-12-26 MED ORDER — CLONIDINE HCL 0.1 MG PO TABS: 0.1000 mg | ORAL_TABLET | Freq: Every day | ORAL | Status: DC

## 2013-12-26 NOTE — ED Notes (Signed)
Pt placed in paper scrubs, belongings placed in personal belongings bag, security paged to wand pt.

## 2013-12-26 NOTE — Tx Team (Signed)
Initial Interdisciplinary Treatment Plan  PATIENT STRENGTHS: (choose at least two) Ability for insight Average or above average intelligence  PATIENT STRESSORS: Financial difficulties Substance abuse   PROBLEM LIST: Problem List/Patient Goals Date to be addressed Date deferred Reason deferred Estimated date of resolution  Substance abuse (pt reports ETOH and heroin mostly) 12/26/2013                                                      DISCHARGE CRITERIA:  Ability to meet basic life and health needs Adequate post-discharge living arrangements Motivation to continue treatment in a less acute level of care Withdrawal symptoms are absent or subacute and managed without 24-hour nursing intervention  PRELIMINARY DISCHARGE PLAN: Attend 12-step recovery group  PATIENT/FAMIILY INVOLVEMENT: This treatment plan has been presented to and reviewed with the patient, Cody Powell, and/or family member.  The patient and family have been given the opportunity to ask questions and make suggestions.  Cody Powell, Cody Powell 12/26/2013, 3:57 AM
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
## 2013-12-26 NOTE — Progress Notes (Signed)
Psychoeducational Group Note  Date: 12/26/2013 Time:  0930  Group Topic/Focus:  Gratefulness:  The focus of this group is to help patients identify what two things they are most grateful for in their lives. What helps ground them and to center them on their work to their recovery.  Participation Level:  Active  Participation Quality:  Appropriate  Affect:  Appropriate  Cognitive:  Oriented  Insight:  Improving  Engagement in Group:  Engaged  Additional Comments:    Anson Peddie A   

## 2013-12-26 NOTE — BH Assessment (Signed)
Assessment complete. [name], [facility], confirmed bed availability. Gave clinical report to Alberteen SamFran Hobson, NP who accepted Pt to the service of Dr. Mervyn GayJ. Jonnalagadda, room 504-1. Notified Dr. Ranae PalmsYelverton and Willene HatchetAnna Lulis, RN of acceptance. Pt will be voluntary and transported by [facility].  Pamalee LeydenFord Ellis Anniyah Mood Jr, [facility], [facility] Triage Specialist [phone]
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
## 2013-12-26 NOTE — ED Notes (Signed)
Tele psych monitor at bedside 

## 2013-12-26 NOTE — BH Assessment (Signed)
Received call for assessment. Attempted twice to speak with Dr. Ranae PalmsYelverton but he was with a patient. Spoke to Dr. Elesa MassedWard who said to go ahead and assess Pt. Tele-assessment will be initiated.  Harlin RainFord Ellis Ria CommentWarrick Jr, LPC, [facility] Triage Specialist [phone]

## 2013-12-26 NOTE — Progress Notes (Signed)
Psychoeducational Group Note  Date:  12/26/2013 Time:  1015  Group Topic/Focus:  Making Healthy Choices:   The focus of this group is to help patients identify negative/unhealthy choices they were using prior to admission and identify positive/healthier coping strategies to replace them upon discharge.  Participation Level:  Active  Participation Quality:  Appropriate  Affect:  Appropriate  Cognitive:  Appropriate  Insight:  Improving  Engagement in Group:  Engaged  Additional Comments:    Indica Marcott A 12/26/2013  
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
## 2013-12-26 NOTE — Progress Notes (Signed)
Adult Psychoeducational Group Note  Date:  12/26/2013 Time:  11:24 PM  Group Topic/Focus:  Wrap-Up Group:   The focus of this group is to help patients review their daily goal of treatment and discuss progress on daily workbooks.  Participation Level:  Did Not Attend  Additional Comments:  Pt was asked to join group but did not come.   Dalia HeadingSeeley, Geneve Kimpel Aileen 12/26/2013, 11:24 PM

## 2013-12-26 NOTE — Progress Notes (Signed)
Patient requesting detox from ETOH and heroin. Patient reports he was here 3 months ago and had been sober almost 90 days and relapsed. He reports that he was got lazy and did not do his 12 steps work or pray. He would like a 28 day treatment program. He is currently homeless and [facility]. Medical hx of asthma. He reports left knee surgery and old lower back surgery scar. Patient denies si/hi/a/v hallucinations, he was oriented to unit and safety maintained.

## 2013-12-26 NOTE — ED Notes (Signed)
Telepsych assessment currently taking place.

## 2013-12-26 NOTE — ED Notes (Addendum)
This RN [facility], [facility], and is going to room 504-1. [facility] transportation is aware of the pt's transfer.

## 2013-12-27 DIAGNOSIS — F1120 Opioid dependence, uncomplicated: Secondary | ICD-10-CM

## 2013-12-27 DIAGNOSIS — F331 Major depressive disorder, recurrent, moderate: Principal | ICD-10-CM

## 2013-12-27 MED ORDER — HYDROXYZINE HCL 25 MG PO TABS: ORAL_TABLET | ORAL | Status: DC

## 2013-12-27 MED ORDER — NICOTINE 21 MG/24HR TD PT24
21.0000 mg | MEDICATED_PATCH | Freq: Every day | TRANSDERMAL | Status: DC
  Administered 2013-12-27: 21 mg via TRANSDERMAL
  Filled 2013-12-27 (×3): qty 1

## 2013-12-27 MED ORDER — HYDROXYZINE HCL 50 MG PO TABS
25.0000 mg | ORAL_TABLET | Freq: Three times a day (TID) | ORAL | Status: DC
  Filled 2013-12-27 (×3): qty 5

## 2013-12-27 NOTE — Progress Notes (Signed)
[facility] Adult Case Management Discharge Plan :  Will you be returning to the same living situation after discharge: Yes,  home with gf until [facility] admission At discharge, do you have transportation home?:Yes,  girlfriend Do you have the ability to pay for your medications:Yes,  Medicare  Release of information consent forms completed and submitted to medical records by CSW.  Patient to Follow up at: Follow-up Information   Follow up with [facility]. (Walk in between 8am-9am Monday through Friday for hospital followup/medication management/assessment for therapy services. )    Contact information:   [address] [place], [place] [address] Phone: [phone] Fax: [phone]      Follow up with [facility]. (Referral sent 12/27/13. Call daily at 9:15AM ([phone]-Melissa/intake coordinator) to check bed availability. )    Contact information:   [address]  [place] [address] Phone: [phone] Fax: [phone]      Patient denies SI/HI:   Yes,  during admission, group, and self report.     Safety Planning and Suicide Prevention discussed:  Yes,  SPE not required for this pt. SPI pamphlet provided to pt and he was encouraged to share information with support network, ask questions, and talk about any concerns relating to SPE.  Smart, Hisako Bugh LCSWA  12/27/2013, 10:36 AM

## 2013-12-27 NOTE — [facility] Suicide Risk Assessment (Signed)
Suicide Risk Assessment  Discharge Assessment     Demographic Factors:  Male and Caucasian  Total Time spent with patient: 45 minutes  Psychiatric Specialty Exam:     Blood pressure 111/76, pulse 89, temperature 98.4 F (36.9 C), temperature source Oral, resp. rate 20, height 5\' 11"  (1.803 m), weight 113.399 kg (250 lb).Body mass index is 34.88 kg/(m^2).  General Appearance: Fairly Groomed  Patent attorneyye Contact::  Fair  Speech:  Clear and Coherent  Volume:  Normal  Mood:  Euthymic  Affect:  Appropriate  Thought Process:  Coherent and Goal Directed  Orientation:  Full (Time, Place, and Person)  Thought Content:  relapse prevention plan  Suicidal Thoughts:  No  Homicidal Thoughts:  No  Memory:  Immediate;   Fair Recent;   Fair Remote;   Fair  Judgement:  Fair  Insight:  Present  Psychomotor Activity:  Normal  Concentration:  Fair  Recall:  FiservFair  Fund of Knowledge:NA  Language: Fair  Akathisia:  No  Handed:    AIMS (if indicated):     Assets:  Desire for Improvement Social Support  Sleep:  Number of Hours: 1.75    Musculoskeletal: Strength & Muscle Tone: within normal limits Gait & Station: normal Patient leans: N/A   Mental Status Per Nursing Assessment::   On Admission:  NA  Current Mental Status by Physician: In full contact with reality. There are no active S/S of withdrawal. Wants to be D/C and [facility]. Will stay with GF while waiting.    Loss Factors: NA  Historical Factors: NA  Risk Reduction Factors:   Positive social support  Continued Clinical Symptoms:  Depression:   Comorbid alcohol abuse/dependence Alcohol/Substance Abuse/Dependencies  Cognitive Features That Contribute To Risk:  Closed-mindedness Polarized thinking Thought constriction (tunnel vision)    Suicide Risk:  Minimal: No identifiable suicidal ideation.  Patients presenting with no risk factors but with morbid ruminations; may be classified as  minimal risk based on the severity of the depressive symptoms  Discharge Diagnoses:   AXIS I:  Opioid Dependence, Alcohol Abuse, MDD AXIS II:  No diagnosis AXIS III:   Past Medical History  Diagnosis Date  . Bronchitis    AXIS IV:  other psychosocial or environmental problems AXIS V:  61-70 mild symptoms  Plan Of Care/Follow-up recommendations:  Activity:  as tolerated Diet:  regular [facility] when bed available Is patient on multiple antipsychotic therapies at discharge:  No   Has Patient had three or more failed trials of antipsychotic monotherapy by history:  No  Recommended Plan for Multiple Antipsychotic Therapies: NA    Aniello Christopoulos A 12/27/2013, 12:12 PM

## 2013-12-27 NOTE — Discharge Summary (Signed)
Physician Discharge Summary Note  Patient:  Cody Powell is an 28 y.o., male MRN:  [id number] DOB:  [birth year] Patient phone:  [phone] (home)  Patient address:   [place] [address],   Total Time spent with patient: Greater than 30 minutes  Date of Admission:  12/26/2013  Date of Discharge: 12/27/13  Reason for Admission:  Opioid detox  Discharge Diagnoses: Active Problems:   MDD (major depressive disorder), recurrent episode, moderate   Psychiatric Specialty Exam: Physical Exam  Constitutional: He is oriented to person, place, and time. He appears well-developed.  HENT:  Head: Normocephalic.  Eyes: Pupils are equal, round, and reactive to light.  Neck: Normal range of motion.  Cardiovascular: Normal rate.   Respiratory: Effort normal.  GI: Soft. Bowel sounds are normal.  Musculoskeletal: Normal range of motion.  Neurological: He is alert and oriented to person, place, and time.  Skin: Skin is warm and dry.  Psychiatric: His speech is normal and behavior is normal. Judgment and thought content normal. His mood appears not anxious. His affect is not angry, not blunt, not labile and not inappropriate. Cognition and memory are normal. He does not exhibit a depressed mood.    Review of Systems  Constitutional: Negative.   HENT: Negative.   Eyes: Negative.   Respiratory: Negative.   Cardiovascular: Negative.   Gastrointestinal: Negative.   Genitourinary: Negative.   Musculoskeletal: Negative.   Skin: Negative.   Neurological: Negative.   Endo/Heme/Allergies: Negative.   Psychiatric/Behavioral: Positive for depression (Stable) and substance abuse (Opioid dependence). Negative for suicidal ideas, hallucinations and memory loss. The patient is not nervous/anxious and does not have insomnia.     Blood pressure 111/76, pulse 89, temperature 98.4 F (36.9 C), temperature source Oral, resp. rate 20, height 5' 11" (1.803 m), weight 113.399 kg (250 lb).Body mass  index is 34.88 kg/(m^2).   Past Psychiatric History: Diagnosis: Opioid dependence,MDD (major depressive disorder), recurrent episode, moderate  Hospitalizations: [facility] adult unit  Outpatient Care: [facility]  Substance Abuse Care: [facility] Referral   Self-Mutilation: NA  Suicidal Attempts: NA  Violent Behaviors: NA   Musculoskeletal: Strength & Muscle Tone: within normal limits Gait & Station: normal Patient leans: N/A  DSM5: Schizophrenia Disorders:  NA Obsessive-Compulsive Disorders:  NA Trauma-Stressor Disorders:  NA Substance/Addictive Disorders:  Opioid Disorder - Severe (304.00) Depressive Disorders:  MDD (major depressive disorder), recurrent episode, moderate   Axis Diagnosis:  AXIS I:  Opioid dependence, MDD (major depressive disorder), recurrent episode, moderate AXIS II:  Deferred AXIS III:   Past Medical History  Diagnosis Date  . Bronchitis    AXIS IV:  other psychosocial or environmental problems and Opioid dependence, chronic AXIS V:  62  Level of Care:  OP  Hospital Course:  Patient is a 28 year old WM who presented to the [facility] requesting help with his recent relapse into heroin and alcohol. He had been clean for almost 90 days and relapsed Friday by drinking almost a case of beer and snorting 1 gram of heroin. He states he wants to get into rehab and had not prioritized his program. He denies any stressors or triggers other than wanting to use again.  Cody Powell's stay in this hospital was rather very brief. He was admitted to the hospital with toxicology reports showing blood alcohol level of 82 and UDS positive for opioid. He was not presenting with the withdrawal symptoms of either opioid or alcohol. However, he was started on Librium detox protocols to combat any withdrawals symptoms of  these substances that he may experience. He was also enrolled in group counseling sessions and AA/NA meetings to learn coping skills that should help him after discharge to cope better  and manage his substance abuse issues better.  Cody Powell approached the providers this am and requested to be discharged to his home. He says he is not having any withdrawal symptoms of any of these substances. He adds that he has been sober x 90 days and when he relapsed, did not use these substances for a long time. He is currently being discharged to follow-up care at the [facility] here in [place], [place]. A referral has been made for patient for the [facility]. He has been provided with all the pertinent information needed to make these appointments without problems.  Upon discharge, Cody Powell adamantly denies any SIHI, AVH, delusional thoughts, paranoia and or withdrawal symptoms. He is discharged on Hydroxyzine 25 mg three times daily as needed for anxiety and provided with 14 days worth supply samples of this medicine. Cody Powell left [facility] with all personal belongings in no distress. Transportation per girlfriend.  Consults:  psychiatry  Significant Diagnostic Studies:  labs: CBC with diff, CMP, UDS, toxicology tests, U/A,   Discharge Vitals:   Blood pressure 111/76, pulse 89, temperature 98.4 F (36.9 C), temperature source Oral, resp. rate 20, height 5' 11" (1.803 m), weight 113.399 kg (250 lb). Body mass index is 34.88 kg/(m^2). Lab Results:   Results for orders placed during the hospital encounter of 12/25/13 (from the past 72 hour(s))  URINE RAPID DRUG SCREEN (HOSP PERFORMED)     Status: Abnormal   Collection Time    12/25/13  8:07 PM      Result Value Ref Range   Opiates POSITIVE (*) NONE DETECTED   Cocaine NONE DETECTED  NONE DETECTED   Benzodiazepines NONE DETECTED  NONE DETECTED   Amphetamines NONE DETECTED  NONE DETECTED   Tetrahydrocannabinol NONE DETECTED  NONE DETECTED   Barbiturates NONE DETECTED  NONE DETECTED   Comment:            DRUG SCREEN FOR MEDICAL PURPOSES     ONLY.  IF CONFIRMATION IS NEEDED     FOR ANY PURPOSE, NOTIFY LAB     WITHIN 5  DAYS.                LOWEST DETECTABLE LIMITS     FOR URINE DRUG SCREEN     Drug Class       Cutoff (ng/mL)     Amphetamine      1000     Barbiturate      200     Benzodiazepine   623     Tricyclics       762     Opiates          300     Cocaine          300     THC              50  ACETAMINOPHEN LEVEL     Status: None   Collection Time    12/25/13  8:35 PM      Result Value Ref Range   Acetaminophen (Tylenol), Serum <15.0  10 - 30 ug/mL   Comment:            THERAPEUTIC CONCENTRATIONS VARY     SIGNIFICANTLY. A RANGE OF 10-30     ug/mL MAY BE AN EFFECTIVE  CONCENTRATION FOR MANY PATIENTS.     HOWEVER, SOME ARE BEST TREATED     AT CONCENTRATIONS OUTSIDE THIS     RANGE.     ACETAMINOPHEN CONCENTRATIONS     >150 ug/mL AT 4 HOURS AFTER     INGESTION AND >50 ug/mL AT 12     HOURS AFTER INGESTION ARE     OFTEN ASSOCIATED WITH TOXIC     REACTIONS.  CBC     Status: Abnormal   Collection Time    12/25/13  8:35 PM      Result Value Ref Range   WBC 6.3  4.0 - 10.5 K/uL   RBC 5.09  4.22 - 5.81 MIL/uL   Hemoglobin 16.7  13.0 - 17.0 g/dL   HCT 44.6  39.0 - 52.0 %   MCV 87.6  78.0 - 100.0 fL   MCH 32.8  26.0 - 34.0 pg   MCHC 37.4 (*) 30.0 - 36.0 g/dL   Comment: RULED OUT INTERFERING SUBSTANCES   RDW 13.3  11.5 - 15.5 %   Platelets 245  150 - 400 K/uL  COMPREHENSIVE METABOLIC PANEL     Status: None   Collection Time    12/25/13  8:35 PM      Result Value Ref Range   Sodium 144  137 - 147 mEq/L   Potassium 3.7  3.7 - 5.3 mEq/L   Chloride 102  96 - 112 mEq/L   CO2 24  19 - 32 mEq/L   Glucose, Bld 71  70 - 99 mg/dL   BUN 12  6 - 23 mg/dL   Creatinine, Ser 0.77  0.50 - 1.35 mg/dL   Calcium 9.3  8.4 - 10.5 mg/dL   Total Protein 7.6  6.0 - 8.3 g/dL   Albumin 4.3  3.5 - 5.2 g/dL   AST 26  0 - 37 U/L   ALT 23  0 - 53 U/L   Alkaline Phosphatase 61  39 - 117 U/L   Total Bilirubin 0.4  0.3 - 1.2 mg/dL   GFR calc non Af Amer >90  >90 mL/min   GFR calc Af Amer >90  >90 mL/min    Comment: (NOTE)     The eGFR has been calculated using the CKD EPI equation.     This calculation has not been validated in all clinical situations.     eGFR's persistently <90 mL/min signify possible Chronic Kidney     Disease.  ETHANOL     Status: Abnormal   Collection Time    12/25/13  8:35 PM      Result Value Ref Range   Alcohol, Ethyl (B) 82 (*) 0 - 11 mg/dL   Comment:            LOWEST DETECTABLE LIMIT FOR     SERUM ALCOHOL IS 11 mg/dL     FOR MEDICAL PURPOSES ONLY  SALICYLATE LEVEL     Status: Abnormal   Collection Time    12/25/13  8:35 PM      Result Value Ref Range   Salicylate Lvl <8.6 (*) 2.8 - 20.0 mg/dL    Physical Findings: AIMS: Facial and Oral Movements Muscles of Facial Expression: None, normal Lips and Perioral Area: None, normal Jaw: None, normal,Extremity Movements Upper (arms, wrists, hands, fingers): None, normal Lower (legs, knees, ankles, toes): None, normal, Trunk Movements Neck, shoulders, hips: None, normal, Overall Severity Severity of abnormal movements (highest score from questions above): None, normal Incapacitation due to abnormal movements: None,  normal Patient's awareness of abnormal movements (rate only patient's report): No Awareness, Dental Status Current problems with teeth and/or dentures?: No Does patient usually wear dentures?: No  CIWA:  CIWA-Ar Total: 0 COWS:  COWS Total Score: 0  Psychiatric Specialty Exam: See Psychiatric Specialty Exam and Suicide Risk Assessment completed by Attending Physician prior to discharge.  Discharge destination:  Home  Is patient on multiple antipsychotic therapies at discharge:  No   Has Patient had three or more failed trials of antipsychotic monotherapy by history:  No  Recommended Plan for Multiple Antipsychotic Therapies: NA     Medication List       Indication   hydrOXYzine 25 MG tablet  Commonly known as:  ATARAX/VISTARIL  Take 1 tab (25 mg) three times daily as needed: For anxiety    Indication:  Tension, Anxiety       Follow-up Information   Follow up with [facility]. (Walk in between 8am-9am Monday through Friday for hospital followup/medication management/assessment for therapy services. )    Contact information:   [address], [place] [address] Phone: [phone] Fax: [phone]      Follow up with [facility]. (Referral sent 12/27/13. Call daily at 9:15AM ([phone]-Melissa/intake coordinator) to check bed availability. )    Contact information:   [address]  [place], [place] [address] Phone: [phone] Fax: [phone]     Follow-up recommendations:  Activity:  As tolerated Diet: As recommended by your primary care doctor. Keep all scheduled follow-up appointments as recommended.  Comments:  Take all your medications as prescribed by your mental healthcare provider. Report any adverse effects and or reactions from your medicines to your outpatient provider promptly. Patient is instructed and cautioned to not engage in alcohol and or illegal drug use while on prescription medicines. In the event of worsening symptoms, patient is instructed to call the crisis hotline, 911 and or go to the nearest ED for appropriate evaluation and treatment of symptoms. Follow-up with your primary care provider for your other medical issues, concerns and or health care needs.   Total Discharge Time:  Greater than 30 minutes.  Signed: Encarnacion Slates, PMHNP-BC 12/27/2013, 11:51 AM Personally evaluated the patient and agree with assessment and plan Geralyn Flash  A. Sabra Heck, M.D.

## 2013-12-27 NOTE — B[facility] Suicide Risk Assessment (Signed)
[facility] INPATIENT: Family/Significant Other Suicide Prevention Education  Suicide Prevention Education:  Education Completed; No one has been identified by the patient as the family member/significant other with whom the patient will be residing, and identified as the person(s) who will aid the patient in the event of a mental health crisis (suicidal ideations/suicide attempt).   Pt did not c/o SI at admission, nor have they endorsed SI during their stay here. SPE not required. SPI pamphlet provided to pt and he was encouraged to share information with support network, ask questions, and talk about any concerns relating to SPE.   The Sherwin-WilliamsHeather Smart, LCSWA 12/27/2013 10:31 AM

## 2013-12-27 NOTE — Progress Notes (Signed)
Adult Psychoeducational Group Note  Date:  12/27/2013 Time:  10:00 am  Group Topic/Focus:  Wellness Toolbox:   The focus of this group is to discuss various aspects of wellness, balancing those aspects and exploring ways to increase the ability to experience wellness.  Patients will create a wellness toolbox for use upon discharge.  Participation Level:  Active  Participation Quality:  Appropriate, Sharing and Supportive  Affect:  Appropriate  Cognitive:  Appropriate  Insight: Appropriate  Engagement in Group:  Engaged  Modes of Intervention:  Discussion, Education, Socialization and Support  Additional Comments:  Pt stated that by eating healthy and quitting smoking that he can promote healthy lifestyle habits. Pt stated that finances and his love for smoking have prevented him from making progress with the goal.   Laural BenesJohnson, Jeiden Daughtridge 12/27/2013, 11:28 AM

## 2013-12-27 NOTE — Progress Notes (Signed)
Patient ID: Cody Powell, male   DOB: 12/26/1985, 28 y.o.   MRN: [id number] Patient is discharged to ride home with girlfriend.  He denies SI/HI.  He verbalizes understanding of his discharge meds and followup.  He was given a supply of visteril and a script.  He is looking forward to further treatment.

## 2013-12-27 NOTE — Tx Team (Signed)
Interdisciplinary Treatment Plan Update (Adult)  Date: 12/27/2013   Time Reviewed: 10:33 AM  Progress in Treatment:  Attending groups: Yes  Participating in groups: Yes   Taking medication as prescribed: Yes  Tolerating medication: Yes  Family/Significant othe contact made: No. SPE not required.   Patient understands diagnosis: Yes, AEB seeking treatment for ETOH detox and mood stabilization.  Discussing patient identified problems/goals with staff: Yes  Medical problems stabilized or resolved: Yes  Denies suicidal/homicidal ideation: Yes during group/self report.  Patient has not harmed self or Others: Yes  New problem(s) identified:  Discharge Plan or Barriers: pt plans to go to [facility]-referral sent. He plans to stay with gf until admission date and [facility] the [facility].  Additional comments: Patient is a 28 year old WM who [facility] with his recent relapse into heroin and alcohol. He had been clean for almost 90 days and relapsed Friday by drinking almost a case of beer and snorting 1 gram of heroin. He states he wants to get into rehab and had not prioritized his program. He denies any stressors or triggers other than wanting to use again. Reason for Continuation of Hospitalization: d/c today  Estimated length of stay: d/c today For review of initial/current patient goals, please see plan of care.  Attendees:  Patient:    Family:    Physician: Geoffery LyonsIrving Lugo MD 12/27/2013 10:32 AM   Nursing: Lupita Leashonna RN  12/27/2013 10:32 AM   Clinical Social Worker Antonisha Waskey Smart, LCSWA  12/27/2013 10:32 AM   Other: Victorino Dikejennifer P. RN  12/27/2013 10:32 AM   Other: Chandra BatchAggie N. PA 12/27/2013 10:32 AM   Other:    Other:    Scribe for Treatment Team:  The Sherwin-WilliamsHeather Smart LCSWA 12/27/2013 10:33 AM

## 2013-12-27 NOTE — Progress Notes (Signed)
Patient has been up and active on the unit, attended AA group this evening and has voiced no complaints. Patient currently denies having pain, -si/hi/a/v hall. Patient denies withdrawal symptoms. Support and encouragement offered, safety maintained on unit, will continue to monitor.

## 2013-12-27 NOTE — [facility] Group Notes (Signed)
[facility] Dis[facility] Planning [facility] Note   12/27/2013 8:45 AM  Participation Quality:  Alert, Appropriate and Oriented  Mood/Affect:  Calm  Depression Rating:  0  Anxiety Rating:  0  Thoughts of Suicide:  Pt denies SI/HI  Will you contract for safety?   Yes  Current AVH:  Pt denies  Plan for Discharge/Comments:  Pt attended discharge planning group and actively participated in group.  CSW provided pt with today's workbook.  Pt is hopeful to d/c today, stating that he doesn't have any symptoms to be here and is not in withdrawal, stating that he relapsed once.  [place] and [facility] and will follow up on his own from home.  CSW will also secure pt's [facility] medication management and therapy.  No further needs voiced by pt at this time.    Transportation Means: Pt reports access to transportation   Supports: No supports mentioned at this time  Reyes IvanChelsea Horton, LCSW 12/27/2013 10:02 AM

## 2013-12-31 NOTE — Progress Notes (Signed)
Patient Discharge Instructions:  After Visit Summary (AVS):   Faxed to:  12/31/13 Discharge Summary Note:   Faxed to:  12/31/13 Psychiatric Admission Assessment Note:   Faxed to:  12/31/13 Suicide Risk Assessment - Discharge Assessment:   Faxed to:  12/31/13 Faxed/Sent to the Next Level Care provider:  12/31/13 [facility] @ [phone] [facility] @ [phone]  Jerelene ReddenSheena E Hayward, 12/31/2013, 2:30 PM

## 2015-12-21 ENCOUNTER — Encounter (HOSPITAL_COMMUNITY): Payer: Self-pay

## 2015-12-21 ENCOUNTER — Other Ambulatory Visit: Payer: Self-pay

## 2015-12-21 ENCOUNTER — Emergency Department (HOSPITAL_COMMUNITY): Payer: Medicare Other

## 2015-12-21 ENCOUNTER — Emergency Department (HOSPITAL_COMMUNITY)
Admission: EM | Admit: 2015-12-21 | Discharge: 2015-12-21 | Disposition: A | Discharge status: Home / Self Care | Payer: Medicare Other | Attending: Emergency Medicine | Admitting: Emergency Medicine

## 2015-12-21 DIAGNOSIS — Z8709 Personal history of other diseases of the respiratory system: Secondary | ICD-10-CM | POA: Diagnosis not present

## 2015-12-21 DIAGNOSIS — F17210 Nicotine dependence, cigarettes, uncomplicated: Secondary | ICD-10-CM | POA: Diagnosis not present

## 2015-12-21 DIAGNOSIS — R002 Palpitations: Secondary | ICD-10-CM | POA: Diagnosis not present

## 2015-12-21 DIAGNOSIS — Z79899 Other long term (current) drug therapy: Secondary | ICD-10-CM | POA: Insufficient documentation

## 2015-12-21 DIAGNOSIS — R079 Chest pain, unspecified: Secondary | ICD-10-CM | POA: Insufficient documentation

## 2015-12-21 LAB — BASIC METABOLIC PANEL
ANION GAP: 6 (ref 5–15)
BUN: 13 mg/dL (ref 6–20)
CHLORIDE: 105 mmol/L (ref 101–111)
CO2: 29 mmol/L (ref 22–32)
Calcium: 9.3 mg/dL (ref 8.9–10.3)
Creatinine, Ser: 0.87 mg/dL (ref 0.61–1.24)
GFR calc Af Amer: >60 mL/min (ref >60)
GFR calc non Af Amer: >60 mL/min (ref >60)
GLUCOSE: 84 mg/dL (ref 65–99)
POTASSIUM: 3.8 mmol/L (ref 3.5–5.1)
Sodium: 140 mmol/L (ref 135–145)

## 2015-12-21 LAB — CBC
HCT: 42.1 % (ref 39.0–52.0)
Hemoglobin: 15.1 g/dL (ref 13.0–17.0)
MCH: 30.8 pg (ref 26.0–34.0)
MCHC: 35.9 g/dL (ref 30.0–36.0)
MCV: 85.9 fL (ref 78.0–100.0)
Platelets: 235 10*3/uL (ref 150–400)
RBC: 4.9 MIL/uL (ref 4.22–5.81)
RDW: 13.8 % (ref 11.5–15.5)
WBC: 6 10*3/uL (ref 4.0–10.5)

## 2015-12-21 LAB — I-STAT TROPONIN, ED
TROPONIN I, POC: 0 ng/mL (ref 0.00–0.08)
TROPONIN I, POC: 0 ng/mL (ref 0.00–0.08)

## 2015-12-21 NOTE — ED Notes (Signed)
Ambulatory w/ steady gait to restroom. 

## 2015-12-21 NOTE — Discharge Instructions (Signed)
Nonspecific Chest Pain °It is often hard to find the cause of chest pain. There is always a chance that your pain could be related to something serious, such as a heart attack or a blood clot in your lungs. Chest pain can also be caused by conditions that are not life-threatening. If you have chest pain, it is very important to follow up with your doctor. ° °HOME CARE °· If you were prescribed an antibiotic medicine, finish it all even if you start to feel better. °· Avoid any activities that cause chest pain. °· Do not use any tobacco products, including cigarettes, chewing tobacco, or electronic cigarettes. If you need help quitting, ask your doctor. °· Do not drink alcohol. °· Take medicines only as told by your doctor. °· Keep all follow-up visits as told by your doctor. This is important. This includes any further testing if your chest pain does not go away. °· Your doctor may tell you to keep your head raised (elevated) while you sleep. °· Make lifestyle changes as told by your doctor. These may include: °¨ Getting regular exercise. Ask your doctor to suggest some activities that are safe for you. °¨ Eating a heart-healthy diet. Your doctor or a diet specialist (dietitian) can help you to learn healthy eating options. °¨ Maintaining a healthy weight. °¨ Managing diabetes, if necessary. °¨ Reducing stress. °GET HELP IF: °· Your chest pain does not go away, even after treatment. °· You have a rash with blisters on your chest. °· You have a fever. °GET HELP RIGHT AWAY IF: °· Your chest pain is worse. °· You have an increasing cough, or you cough up blood. °· You have severe belly (abdominal) pain. °· You feel extremely weak. °· You pass out (faint). °· You have chills. °· You have sudden, unexplained chest discomfort. °· You have sudden, unexplained discomfort in your arms, back, neck, or jaw. °· You have shortness of breath at any time. °· You suddenly start to sweat, or your skin gets clammy. °· You feel  nauseous. °· You vomit. °· You suddenly feel light-headed or dizzy. °· Your heart begins to beat quickly, or it feels like it is skipping beats. °These symptoms may be an emergency. Do not wait to see if the symptoms will go away. Get medical help right away. Call your local emergency services (911 in the [place] Do not drive yourself to the hospital. °  °This information is not intended to replace advice given to you by your health care provider. Make sure you discuss any questions you have with your health care provider. °  °Document Released: 02/26/2008 Document Revised: 09/30/2014 Document Reviewed: 04/15/2014 °Elsevier Interactive Patient Education ©2016 [employer] ° °Palpitations °A palpitation is the feeling that your heartbeat is irregular. It may feel like your heart is fluttering or skipping a beat. It may also feel like your heart is beating faster than normal. This is usually not a serious problem. In some cases, you may need more medical tests. °HOME CARE °· Avoid: °¨ Caffeine in coffee, tea, soft drinks, diet pills, and energy drinks. °¨ Chocolate. °¨ Alcohol. °· Stop smoking if you smoke. °· Reduce your stress and anxiety. Try: °¨ A method that measures bodily functions so you can learn to control them (biofeedback). °¨ Yoga. °¨ Meditation. °¨ Physical activity such as swimming, jogging, or walking. °· Get plenty of rest and sleep. °GET HELP IF: °· Your fast or irregular heartbeat continues after 24 hours. °· Your palpitations occur   more often. °GET HELP RIGHT AWAY IF:  °· You have chest pain. °· You feel short of breath. °· You have a very bad headache. °· You feel dizzy or pass out (faint). °MAKE SURE YOU:  °· Understand these instructions. °· Will watch your condition. °· Will get help right away if you are not doing well or get worse. °  °This information is not intended to replace advice given to you by your health care provider. Make sure you discuss any questions you have with your health  care provider. °  °Document Released: 06/18/2008 Document Revised: 09/30/2014 Document Reviewed: 11/08/2011 °Elsevier Interactive Patient Education ©2016 [employer] ° °

## 2015-12-21 NOTE — ED Notes (Addendum)
Patient here with chest discomfort that he describes as a pounding in his chest x 2 days. Reports some shortness of breath with same, appears anxious on arrival. Tearful with same

## 2015-12-21 NOTE — ED Provider Notes (Signed)
CSN: [id number]     Arrival date & time 12/21/15  1426 History   First MD Initiated Contact with Patient 12/21/15 1645     Chief Complaint  Patient presents with  . Chest Pain     (Consider location/radiation/quality/duration/timing/severity/associated sxs/prior Treatment) HPI 30 year old male with no significant coronary artery risk factors or sudden death risk factors who presents today complaining of palpitations that began approximately 30 minutes prior to my evaluation. He states he felt heartbeats pounding in his chest. He did feel some shortness of breath. He was not lightheaded, nauseated,. He has had no previous symptoms of same. He denies any family history of coronary artery disease or sudden death. He has not been ill recently with any cough or fever. He denies any leg swelling, history of DVT or PE. Has had no recent immobilizations or DVT risk factors. Although social history states smoker he states that he quit over a year ago. Past Medical History  Diagnosis Date  . Bronchitis    Past Surgical History  Procedure Laterality Date  . Back surgery    . Knee surgery Left    Family History  Problem Relation Age of Onset  . Cancer Other    Social History  Substance Use Topics  . Smoking status: Current Every Day Smoker -- 2.00 packs/day    Types: Cigarettes  . Smokeless tobacco: None  . Alcohol Use: 7.2 oz/week    12 Cans of beer per week    Review of Systems  All other systems reviewed and are negative.     Allergies  Review of patient's allergies indicates no known allergies.  Home Medications   Prior to Admission medications   Medication Sig Start Date End Date Taking? Authorizing Provider  testosterone cypionate (DEPOTESTOTERONE CYPIONATE) 100 MG/ML injection Inject into the muscle every 14 (fourteen) days. For IM use only   Yes Historical Provider, MD  ZUBSOLV 5.7-1.4 MG SUBL DISSOLVE ONE TABLET UNDER THE TONGUE 2 TIMES DAILY 11/23/15  Yes Historical  Provider, MD   BP 110/71 mmHg  Pulse 59  Temp(Src) 98.8 F (37.1 C) (Oral)  Resp 11  Ht 6' (1.829 m)  Wt 120.203 kg  BMI 35.93 kg/m2  SpO2 97% Physical Exam  Constitutional: He is oriented to person, place, and time. He appears well-developed and well-nourished.  HENT:  Head: Normocephalic and atraumatic.  Right Ear: External ear normal.  Left Ear: External ear normal.  Nose: Nose normal.  Mouth/Throat: Oropharynx is clear and moist.  Eyes: Conjunctivae and EOM are normal. Pupils are equal, round, and reactive to light.  Neck: Normal range of motion. Neck supple.  Cardiovascular: Normal rate, regular rhythm, normal heart sounds and intact distal pulses.   Pulmonary/Chest: Effort normal and breath sounds normal. No respiratory distress. He has no wheezes. He exhibits no tenderness.  Abdominal: Soft. Bowel sounds are normal. He exhibits no distension and no mass. There is no tenderness. There is no guarding.  Musculoskeletal: Normal range of motion.  Neurological: He is alert and oriented to person, place, and time. He has normal reflexes. He exhibits normal muscle tone. Coordination normal.  Skin: Skin is warm and dry.  Psychiatric: He has a normal mood and affect. His behavior is normal. Judgment and thought content normal.  Nursing note and vitals reviewed.   ED Course  Procedures (including critical care time) Labs Review Labs Reviewed  BASIC METABOLIC PANEL  CBC  I-STAT TROPOININ, ED  I-STAT TROPOININ, ED  Rosezena Sensor, ED  Imaging Review Dg Chest 2 View  12/21/2015  CLINICAL DATA:  Chest palpitations and shortness of breath over the past few days. Cramping sensation left shoulder. Initial encounter. EXAM: CHEST  2 VIEW COMPARISON:  PA and lateral chest 12/19/2011. FINDINGS: The lungs are clear. Heart size is normal. There is no pneumothorax or pleural effusion. No focal bony abnormality. IMPRESSION: Negative chest. Electronically Signed   By: Drusilla Kannerhomas  Dalessio  M.D.   On: 12/21/2015 15:44   I have personally reviewed and evaluated these images and lab results as part of my medical decision-making.   EKG Interpretation   Date/Time:  Thursday December 21 2015 14:34:02 EDT Ventricular Rate:  93 PR Interval:  188 QRS Duration: 100 QT Interval:  346 QTC Calculation: 430 R Axis:   110 Text Interpretation:  Normal sinus rhythm Right axis deviation Abnormal  ECG Confirmed by Kelli Egolf MD, Duwayne HeckANIELLE ([id number]) on 12/21/2015 4:46:21 PM      MDM   Final diagnoses:  Palpitations  Chest pain, unspecified chest pain type    30 year old male with low pretest probability for coronary artery disease who presents today with episode of palpitation and chest pain. He has been on the monitor here and has no noted arrhythmias. Troponin normal with no acute ST changes on EKG. Repeat troponin also normal. Discussed with patient return precautions and need for follow-up and he voices understanding.    Margarita Grizzleanielle Anaia Frith, MD 12/23/15 [phone]

## 2015-12-21 NOTE — ED Notes (Signed)
Pt reports to this RN at this time that he gives himself testosterone shots once a week in left hip and states he thinks he may have an infection at site of injections d/t warmth/redness. Bruising noted by this RN, no redness or inflammation noted by this RN

## 2015-12-21 NOTE — ED Notes (Signed)
Pt verbalized understanding of d/c instructions and follow-up care. No further questions/concerns, VSS, ambulatory w/ steady gait (refused wheelchair) 

## 2016-07-31 ENCOUNTER — Encounter (HOSPITAL_COMMUNITY): Payer: Self-pay | Admitting: Emergency Medicine

## 2016-07-31 ENCOUNTER — Ambulatory Visit (INDEPENDENT_AMBULATORY_CARE_PROVIDER_SITE_OTHER): Payer: Medicare Other

## 2016-07-31 ENCOUNTER — Ambulatory Visit (HOSPITAL_COMMUNITY)
Admission: EM | Admit: 2016-07-31 | Discharge: 2016-07-31 | Disposition: A | Discharge status: Home / Self Care | Payer: PRIVATE HEALTH INSURANCE | Attending: Family Medicine | Admitting: Family Medicine

## 2016-07-31 DIAGNOSIS — S161XXA Strain of muscle, fascia and tendon at neck level, initial encounter: Secondary | ICD-10-CM | POA: Diagnosis not present

## 2016-07-31 DIAGNOSIS — S39012A Strain of muscle, fascia and tendon of lower back, initial encounter: Secondary | ICD-10-CM
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
## 2018-09-24 ENCOUNTER — Encounter (HOSPITAL_COMMUNITY): Payer: Self-pay

## 2018-09-24 ENCOUNTER — Other Ambulatory Visit: Payer: Self-pay

## 2018-09-24 ENCOUNTER — Emergency Department (HOSPITAL_COMMUNITY)
Admission: EM | Admit: 2018-09-24 | Discharge: 2018-09-24 | Disposition: A | Discharge status: Home / Self Care | Payer: BLUE CROSS/BLUE SHIELD | Attending: Emergency Medicine | Admitting: Emergency Medicine

## 2018-09-24 ENCOUNTER — Emergency Department (HOSPITAL_COMMUNITY): Payer: BLUE CROSS/BLUE SHIELD

## 2018-09-24 DIAGNOSIS — F17210 Nicotine dependence, cigarettes, uncomplicated: Secondary | ICD-10-CM | POA: Diagnosis not present

## 2018-09-24 DIAGNOSIS — R0789 Other chest pain: Secondary | ICD-10-CM | POA: Diagnosis not present

## 2018-09-24 DIAGNOSIS — I10 Essential (primary) hypertension: Secondary | ICD-10-CM | POA: Diagnosis not present

## 2018-09-24 DIAGNOSIS — Z79899 Other long term (current) drug therapy: Secondary | ICD-10-CM | POA: Insufficient documentation

## 2018-09-24 HISTORY — DX: Essential (primary) hypertension: I10

## 2018-09-24 LAB — CBC
HCT: 46.3 % (ref 39.0–52.0)
Hemoglobin: 16 g/dL (ref 13.0–17.0)
MCH: 30.2 pg (ref 26.0–34.0)
MCHC: 34.6 g/dL (ref 30.0–36.0)
MCV: 87.4 fL (ref 80.0–100.0)
Platelets: 219 10*3/uL (ref 150–400)
RBC: 5.3 MIL/uL (ref 4.22–5.81)
RDW: 12.3 % (ref 11.5–15.5)
WBC: 6.1 10*3/uL (ref 4.0–10.5)
nRBC: 0 % (ref 0.0–0.2)

## 2018-09-24 LAB — I-STAT TROPONIN, ED
Troponin i, poc: 0 ng/mL (ref 0.00–0.08)
Troponin i, poc: 0 ng/mL (ref 0.00–0.08)

## 2018-09-24 LAB — BASIC METABOLIC PANEL
Anion gap: 9 (ref 5–15)
BUN: 16 mg/dL (ref 6–20)
CO2: 29 mmol/L (ref 22–32)
Calcium: 9.7 mg/dL (ref 8.9–10.3)
Chloride: 102 mmol/L (ref 98–111)
Creatinine, Ser: 0.87 mg/dL (ref 0.61–1.24)
GFR calc Af Amer: >60 mL/min (ref >60)
GFR calc non Af Amer: >60 mL/min (ref >60)
Glucose, Bld: 112 mg/dL — ABNORMAL HIGH (ref 70–99)
Potassium: 3.8 mmol/L (ref 3.5–5.1)
Sodium: 140 mmol/L (ref 135–145)

## 2018-09-24 LAB — D-DIMER, QUANTITATIVE: D-Dimer, Quant: 0.3 ug/mL-FEU (ref 0.00–0.50)

## 2018-09-24 LAB — TSH: TSH: 3.101 u[IU]/mL (ref 0.350–4.500)

## 2018-09-24 MED ORDER — KETOROLAC TROMETHAMINE 30 MG/ML IJ SOLN
30.0000 mg | Freq: Once | INTRAMUSCULAR | Status: AC
  Administered 2018-09-24: 30 mg via INTRAVENOUS
  Filled 2018-09-24: qty 1

## 2018-09-24 NOTE — ED Provider Notes (Signed)
[facility] EMERGENCY DEPARTMENT Provider Note   CSN: [id number] Arrival date & time: 09/24/18  1310     History   Chief Complaint Chief Complaint  Patient presents with  . Chest Pain  . Shortness of Breath    HPI Cody Powell is a 33 y.o. male with history of MDD, polysubstance abuse in remission, tobacco abuse, hypertension presenting for evaluation of acute onset, intermittent chest pain since yesterday.  He reports that last night he began to feel generalized chest pressure and a "fluttery "feeling in his chest.  This was intermittent, lasting several seconds and he experienced this every few minutes.  States that he was unable to sleep very much due to the sensation and it seemed to worsen laying flat and with deep inspiration.  Earlier today while at work at a [employer] site he began to feel worsening generalized chest pressure with radiation of his symptoms into the left upper extremity.  This has since resolved.  He notes associated shortness of breath and nausea but denies vomiting, diaphoresis, or lightheadedness.  No abdominal pain.  He quit smoking 2 months ago.  Denies leg swelling, recent travel or surgeries, hemoptysis, prior history of DVT or PE, or history of cancer.  He was on testosterone supplementation but had to discontinue this several months ago due to financial constraints.  No family history of cardiac disease at a young age.  Drinks 2 cups of coffee daily, denies recreational drug use.  He took an antacid without relief of his symptoms.  He does feel very anxious when thinking about the symptoms and thinks he may have had a panic attack earlier today.   The history is provided by the patient.    Past Medical History:  Diagnosis Date  . Bronchitis   . Hypertension     Patient Active Problem List   Diagnosis Date Noted  . MDD (major depressive disorder), recurrent episode, moderate (HCC) 12/26/2013  . Opioid type dependence, continuous  (HCC) 09/27/2013  . Alcohol dependence (HCC) 09/27/2013  . Polysubstance abuse (HCC) 09/26/2013  . Alcohol abuse 09/26/2013  . Depression 09/26/2013  . MDD (major depressive disorder) 09/26/2013    Past Surgical History:  Procedure Laterality Date  . BACK SURGERY    . KNEE SURGERY Left         Home Medications    Prior to Admission medications   Medication Sig Start Date End Date Taking? Authorizing Provider  gabapentin (NEURONTIN) 100 MG capsule Take 100 mg by mouth 2 (two) times daily.    [provider]  testosterone cypionate (DEPOTESTOTERONE CYPIONATE) 100 MG/ML injection Inject into the muscle every 14 (fourteen) days. For IM use only    [provider]  ZUBSOLV 5.7-1.4 MG SUBL DISSOLVE ONE TABLET UNDER THE TONGUE 2 TIMES DAILY 11/23/15   [provider]    Family History Family History  Problem Relation Age of Onset  . Cancer Other     Social History Social History   Tobacco Use  . Smoking status: Current Every Day Smoker    Packs/day: 2.00    Types: Cigarettes  . Smokeless tobacco: Never Used  Substance Use Topics  . Alcohol use: Yes    Alcohol/week: 12.0 standard drinks    Types: 12 Cans of beer per week  . Drug use: Yes    Types: Cocaine, Heroin, "Crack" cocaine, Marijuana     Allergies   Patient has no known allergies.   Review of Systems Review of Systems  Constitutional: Negative for chills, diaphoresis and fever.  Respiratory: Positive for shortness of breath. Negative for cough.   Cardiovascular: Positive for chest pain and palpitations. Negative for leg swelling.  Gastrointestinal: Positive for nausea. Negative for abdominal pain and vomiting.  All other systems reviewed and are negative.    Physical Exam Updated Vital Signs BP 132/83   Pulse 62   Temp 98 F (36.7 C) (Oral)   Resp 18   SpO2 97%   Physical Exam Vitals signs and nursing note reviewed.  Constitutional:      General: He is not in acute  distress.    Appearance: He is well-developed.  HENT:     Head: Normocephalic and atraumatic.  Eyes:     General:        Right eye: No discharge.        Left eye: No discharge.     Conjunctiva/sclera: Conjunctivae normal.  Neck:     Musculoskeletal: Normal range of motion and neck supple.     Vascular: No JVD.     Trachea: No tracheal deviation.  Cardiovascular:     Rate and Rhythm: Normal rate.     Pulses:          Carotid pulses are 2+ on the right side and 2+ on the left side.      Radial pulses are 2+ on the right side and 2+ on the left side.       Dorsalis pedis pulses are 2+ on the right side and 2+ on the left side.       Posterior tibial pulses are 2+ on the right side and 2+ on the left side.     Heart sounds: Normal heart sounds.     Comments: Homans sign absent bilaterally, no lower extremity edema, no palpable cords, compartments are soft  Pulmonary:     Effort: Pulmonary effort is normal. No tachypnea or respiratory distress.  Chest:     Chest wall: No tenderness.  Abdominal:     General: There is no distension.     Palpations: Abdomen is soft.     Tenderness: There is no abdominal tenderness.  Musculoskeletal:     Right lower leg: He exhibits no tenderness. No edema.     Left lower leg: He exhibits no tenderness. No edema.  Skin:    General: Skin is warm and dry.     Findings: No erythema.  Neurological:     Mental Status: He is alert.  Psychiatric:        Behavior: Behavior normal.      ED Treatments / Results  Labs (all labs ordered are listed, but only abnormal results are displayed) Labs Reviewed  BASIC METABOLIC PANEL - Abnormal; Notable for the following components:      Result Value   Glucose, Bld 112 (*)    All other components within normal limits  CBC  D-DIMER, QUANTITATIVE (NOT AT [facility])  TSH  I-STAT TROPONIN, ED  I-STAT TROPONIN, ED    EKG EKG Interpretation  Date/Time:  Thursday September 24 2018 13:15:57 EST Ventricular Rate:    97 PR Interval:  172 QRS Duration: 112 QT Interval:  340 QTC Calculation: 431 R Axis:   116 Text Interpretation:  Normal sinus rhythm Left posterior fascicular block Cannot rule out Anterior infarct , age undetermined Abnormal ECG similar to prior 3/19 Confirmed by Meridee ScoreButler, Michael [phone]([id number]) on 09/24/2018 5:37:23 PM   Radiology Dg Chest 2 View  Result Date: 09/24/2018 CLINICAL DATA:  Chest pain shortness of breath. EXAM: CHEST - 2 VIEW COMPARISON:  12/21/2015. FINDINGS: The heart size and mediastinal contours are within normal limits. Both lungs are clear. The visualized skeletal structures are unremarkable. IMPRESSION: No active cardiopulmonary disease.  No change from priors. Electronically Signed   By: Elsie StainJohn T Curnes M.D.   On: 09/24/2018 14:22    Procedures Procedures (including critical care time)  Medications Ordered in ED Medications  ketorolac (TORADOL) 30 MG/ML injection 30 mg (30 mg Intravenous Given 09/24/18 1819)     Initial Impression / Assessment and Plan / ED Course  I have reviewed the triage vital signs and the nursing notes.  Pertinent labs & imaging results that were available during my care of the patient were reviewed by me and considered in my medical decision making (see chart for details).     Patient presenting for evaluation of chest pressure and left upper extremity pain today.  He is afebrile, initially hypertensive with improvement on reevaluation.  Pain is not reproducible on palpation and is not exertional but may and may have a pleuritic component.  EKG shows no acute ischemic changes or arrhythmia.  Chest x-ray shows no acute cardiopulmonary abnormalities.  Serial troponins are negative.  With a HEART score of 3, low suspicion of ACS/MI.  D-dimer negative, low suspicion of PE.  Remainder of lab work reviewed by me shows no leukocytosis, no anemia, no metabolic derangements.  No renal insufficiency.  TSH within normal limits.  Patient given Toradol with  significant improvement in his pain on reevaluation. No evidence of cardiac tamponade, esophageal rupture, pneumothorax, pneumonia, or other acute serious cardiopulmonary abnormalities.  No further emergent work-up required at this time.  Stable for discharge home with follow-up with PCP or cardiology for reevaluation of symptoms.  Discussed strict ED return precautions. Pt verbalized understanding of and agreement with plan and is safe for discharge home at this time.   Final Clinical Impressions(s) / ED Diagnoses   Final diagnoses:  Atypical chest pain  Hypertension, unspecified type    ED Discharge Orders    None       Bennye AlmFawze, Rozena Fierro A, PA-C 09/24/18 2345    Terrilee FilesButler, Michael C, MD 09/25/18 [phone]

## 2018-09-24 NOTE — Discharge Instructions (Addendum)
Your work-up today was reassuring that you are not having a heart attack and do not have a blood clot in your lungs.  Alternate 600 mg of ibuprofen and [phone] mg of Tylenol every 3 hours as needed for pain. Do not exceed 4000 mg of Tylenol daily.  Take ibuprofen with food to avoid upset stomach issues.  You can also try taking an antacid medicine like Tums or Pepcid and avoid spicy foods, fried foods, fatty foods, alcohol or acidic foods which could potentially be causing some of your symptoms if you are having bad acid reflux.  Follow-up with a primary care doctor or a cardiologist for reevaluation of your symptoms.  Return to the emergency department if any concerning signs or symptoms develop such as high fevers, worsening pain, persistent vomiting, or shortness of breath.  If your blood pressure (BP) was elevated on multiple readings during this visit above 130 for the top number or above 80 for the bottom number, please have this repeated by your primary care provider within one month. You can also check your blood pressure when you are out at a pharmacy or grocery store. Many have machines that will check your blood pressure.  If your blood pressure remains elevated, please follow-up with your PCP.

## 2018-09-24 NOTE — ED Notes (Signed)
Pt stable and ambulatory for discharge, states understanding follow up.  

## 2018-09-24 NOTE — ED Triage Notes (Signed)
Pt reports centralized chest tightness radiating to his left arm since last night. Pt also reports SOB, pt anxious and tearful in triage.

## 2020-01-22 IMAGING — DX DG CHEST 2V
2 series · 2 of 2 positions shown · non-contrast
Comparison: 12/21/2015.

CLINICAL DATA: Chest pain shortness of breath.

EXAM:
CHEST - 2 VIEW

[chest pa]
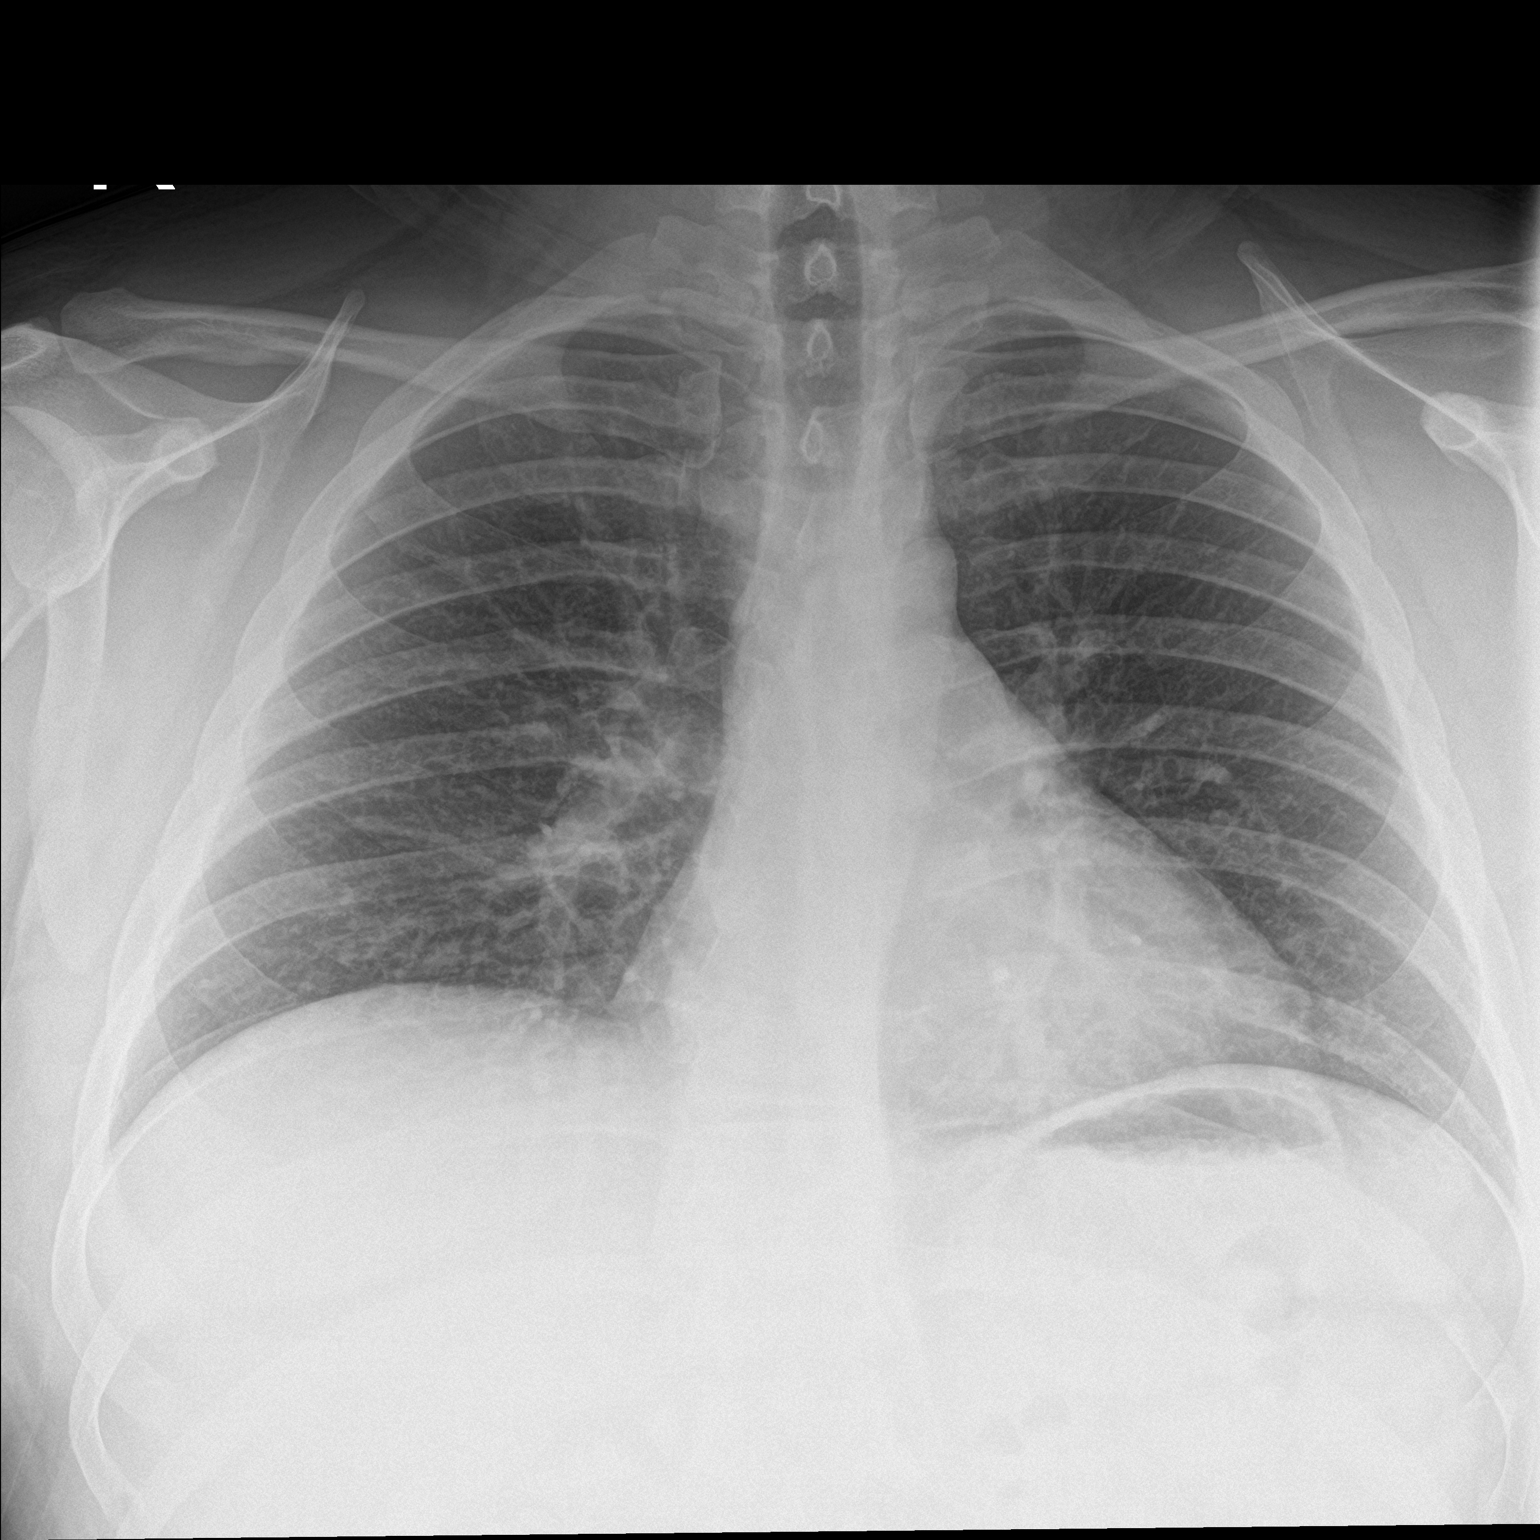

[chest lat]
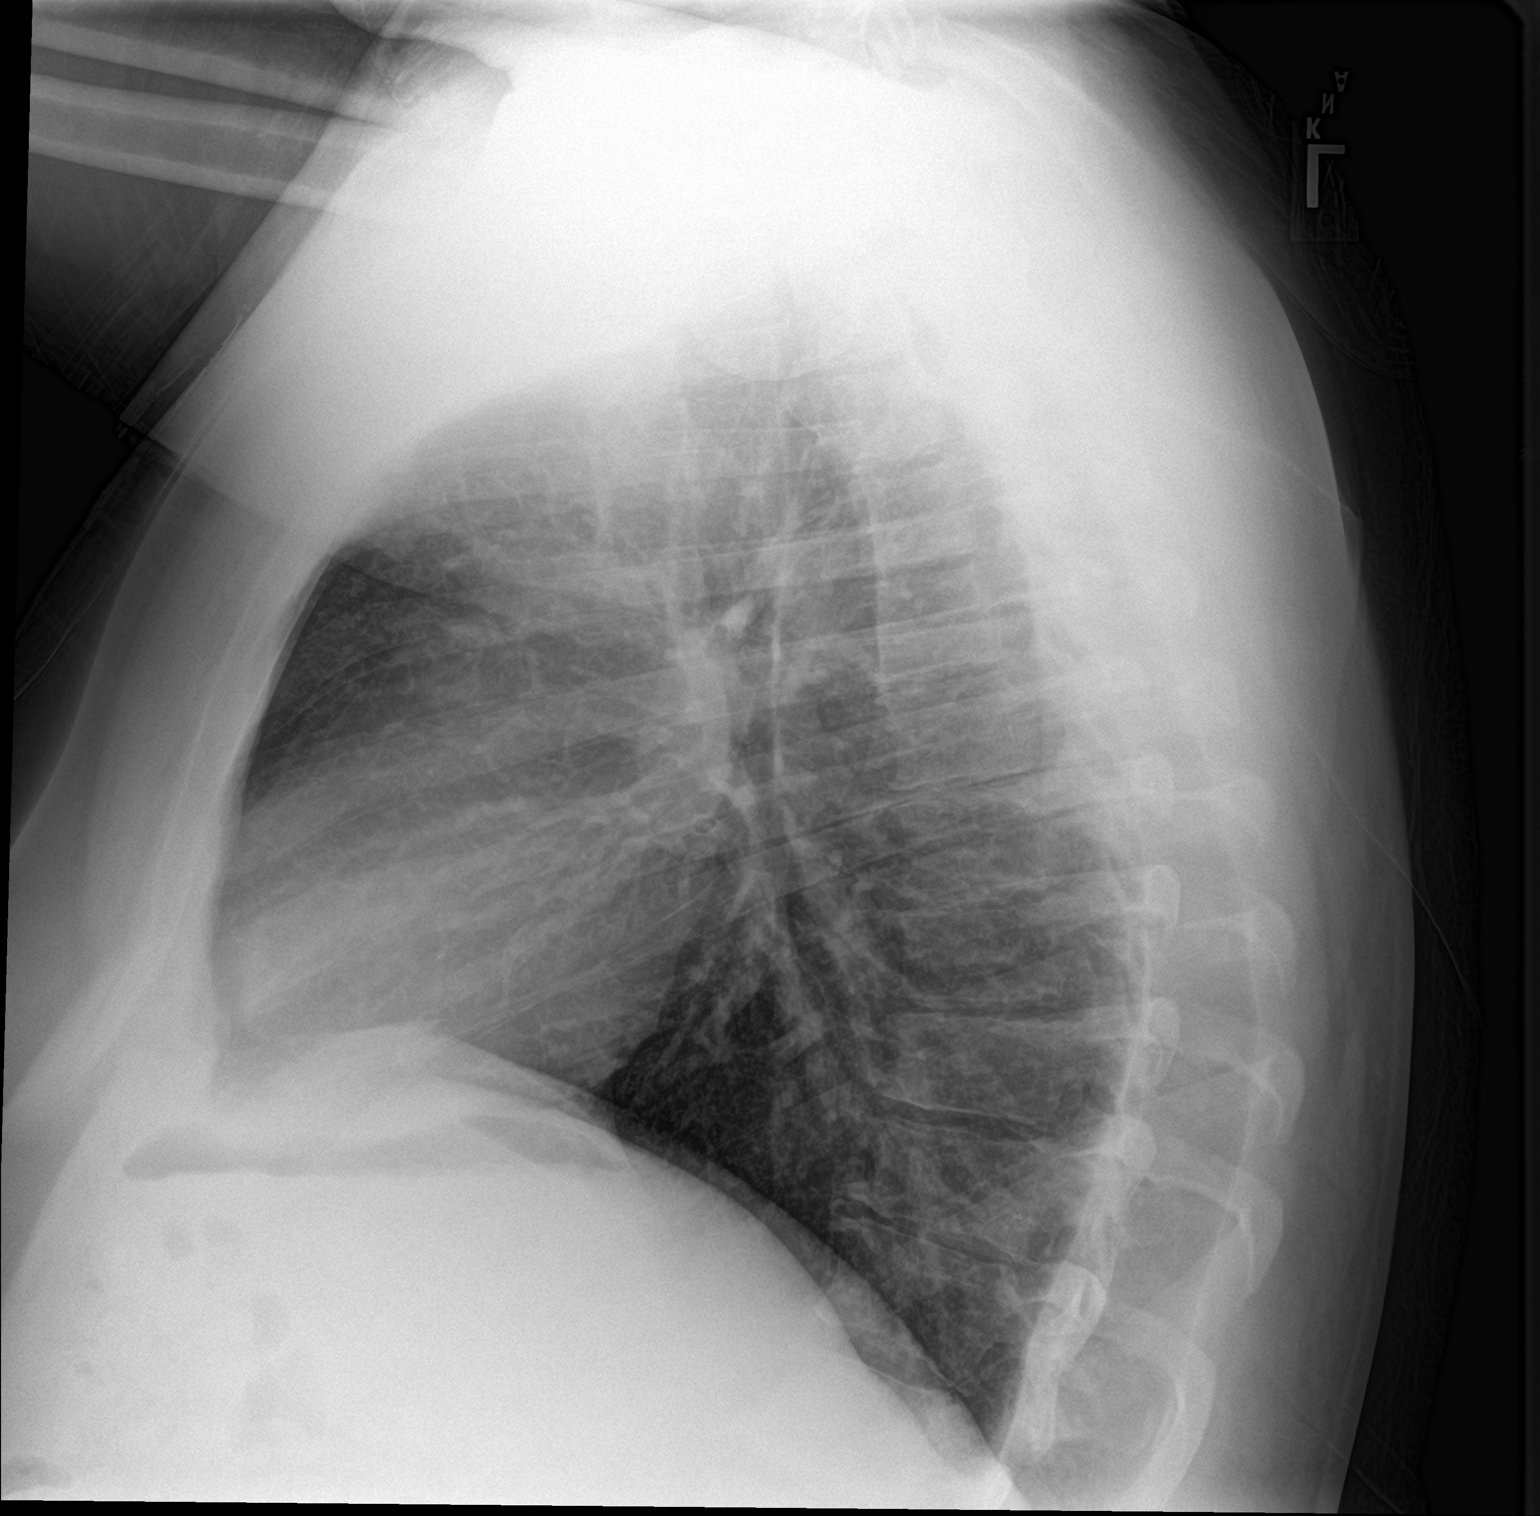

[2 of 2 positions shown; findings below may reference images not displayed]

FINDINGS: The heart size and mediastinal contours are within normal limits.
Both lungs are clear. The visualized skeletal structures are
unremarkable.
IMPRESSION: No active cardiopulmonary disease.  No change from priors.

## 2020-10-12 ENCOUNTER — Other Ambulatory Visit: Payer: Self-pay | Admitting: Surgical Oncology

## 2020-10-12 DIAGNOSIS — Z6841 Body Mass Index (BMI) 40.0 and over, adult: Secondary | ICD-10-CM

## 2020-11-08 ENCOUNTER — Other Ambulatory Visit: Payer: PRIVATE HEALTH INSURANCE

## 2022-01-07 ENCOUNTER — Other Ambulatory Visit: Payer: Self-pay

## 2022-01-07 ENCOUNTER — Emergency Department (HOSPITAL_BASED_OUTPATIENT_CLINIC_OR_DEPARTMENT_OTHER): Payer: PRIVATE HEALTH INSURANCE | Admitting: Radiology

## 2022-01-07 ENCOUNTER — Encounter (HOSPITAL_BASED_OUTPATIENT_CLINIC_OR_DEPARTMENT_OTHER): Payer: Self-pay

## 2022-01-07 ENCOUNTER — Emergency Department (HOSPITAL_BASED_OUTPATIENT_CLINIC_OR_DEPARTMENT_OTHER)
Admission: EM | Admit: 2022-01-07 | Discharge: 2022-01-07 | Disposition: A | Discharge status: Home / Self Care | Payer: PRIVATE HEALTH INSURANCE | Attending: Emergency Medicine | Admitting: Emergency Medicine

## 2022-01-07 DIAGNOSIS — Y990 Civilian activity done for income or pay: Secondary | ICD-10-CM | POA: Diagnosis not present

## 2022-01-07 DIAGNOSIS — S8992XA Unspecified injury of left lower leg, initial encounter: Secondary | ICD-10-CM | POA: Insufficient documentation

## 2022-01-07 DIAGNOSIS — X501XXA Overexertion from prolonged static or awkward postures, initial encounter: Secondary | ICD-10-CM | POA: Diagnosis not present

## 2022-01-07 DIAGNOSIS — M25562 Pain in left knee: Secondary | ICD-10-CM | POA: Diagnosis not present

## 2022-01-07 NOTE — ED Provider Notes (Signed)
?[facility] EMERGENCY DEPT ?Provider Note ? ? ?CSN: [id number] ?Arrival date & time: 01/07/22  1511 ? ?  ? ?History ? ?Chief Complaint  ?Patient presents with  ? Knee Injury  ? ? ?Cody Powell is a 36 y.o. male. ? ?Patient with no pertinent past medical history presents today for left knee pain. States that same began this morning when he was working at a [employer] site and accidentally stepped through sheet rock. He states that during the incident he twisted his knee and felt a pop with immediate pain afterwards. States that he has a history of several knee surgeries in the past and was concerned that he may have re-injured his knee which prompted his visit today. Denies hitting his head or any loss of consciousness. States he was able to walk afterwards with some pain. ? ?The history is provided by the patient. No language interpreter was used.  ? ?  ? ?Home Medications ?Prior to Admission medications   ?Medication Sig Start Date End Date Taking? Authorizing Provider  ?gabapentin (NEURONTIN) 100 MG capsule Take 100 mg by mouth 2 (two) times daily.    [provider]  ?testosterone cypionate (DEPOTESTOTERONE CYPIONATE) 100 MG/ML injection Inject into the muscle every 14 (fourteen) days. For IM use only    [provider]  ?ZUBSOLV 5.7-1.4 MG SUBL DISSOLVE ONE TABLET UNDER THE TONGUE 2 TIMES DAILY 11/23/15   [provider]  ?   ? ?Allergies    ?Patient has no known allergies.   ? ?Review of Systems   ?Review of Systems  ?Constitutional:  Negative for chills and fever.  ?Musculoskeletal:  Positive for arthralgias and myalgias.  ?All other systems reviewed and are negative. ? ?Physical Exam ?Updated Vital Signs ?BP 130/85 (BP Location: Right Arm)   Pulse 61   Temp 97.8 ?F (36.6 ?C)   Resp 16   Ht 6' (1.829 m)   Wt 102.2 kg   SpO2 98%   BMI 30.56 kg/m?  ?Physical Exam ?Vitals and nursing note reviewed.  ?Constitutional:   ?   General: He is not in acute distress. ?    Appearance: Normal appearance. He is normal weight. He is not ill-appearing, toxic-appearing or diaphoretic.  ?HENT:  ?   Head: Normocephalic and atraumatic.  ?Cardiovascular:  ?   Rate and Rhythm: Normal rate.  ?Pulmonary:  ?   Effort: Pulmonary effort is normal. No respiratory distress.  ?Musculoskeletal:  ?   Cervical back: Normal range of motion.  ?   Comments: Tenderness to palpation of anterior medial part of the left knee. No bruising, swelling, or obvious deformity noted to the knee. DP and PT pulses intact and 2+. ? ?Small abrasion noted to the right hip. No tenderness, swelling, bruising, or obvious deformity noted.  ? ?Gait intact.  ?Skin: ?   General: Skin is warm and dry.  ?Neurological:  ?   General: No focal deficit present.  ?   Mental Status: He is alert.  ?Psychiatric:     ?   Mood and Affect: Mood normal.     ?   Behavior: Behavior normal.  ? ? ?ED Results / Procedures / Treatments   ?Labs ?(all labs ordered are listed, but only abnormal results are displayed) ?Labs Reviewed - No data to display ? ?EKG ?None ? ?Radiology ?DG Knee Complete 4 Views Left ? ?Result Date: 01/07/2022 ?CLINICAL DATA:  Left knee injury during fall today. EXAM: LEFT KNEE - COMPLETE 4+ VIEW COMPARISON:  Radiographs 07/06/2019  and 01/13/2020. FINDINGS: The mineralization and alignment are normal. There is no evidence of acute fracture or dislocation. There are tricompartmental degenerative changes. Surgical screws within the proximal tibia have been removed in the interval. There are stable postsurgical changes in the distal femur. No unexpected foreign body or focal soft tissue abnormality identified. IMPRESSION: Degenerative and postsurgical changes as described. No evidence of acute osseous injury. Electronically Signed   By: Richardean Sale M.D.   On: 01/07/2022 17:02   ? ?Procedures ?Procedures  ? ? ?Medications Ordered in ED ?Medications - No data to display ? ?ED Course/ Medical Decision Making/ A&P ?  ?                         ?Medical Decision Making ?Amount and/or Complexity of Data Reviewed ?Radiology: ordered. ? ? ?Patient presents today after injuring his left knee after stepping through sheet rock. Patient X-Ray negative for obvious fracture or dislocation. Pt advised to follow up with orthopedics if symptoms persist for possibility of missed fracture diagnosis or ligamentous injury. Patient states he plans to call his orthopedist who performed his previous knee surgeries tomorrow for further evaluation and management. Patient given knee immobilizer and crutches while in ED, conservative therapy recommended and discussed. Patient will be dc home & is agreeable with above plan. Discharged in stable condition. ? ? ?Final Clinical Impression(s) / ED Diagnoses ?Final diagnoses:  ?Injury of left knee, initial encounter  ? ? ?Rx / DC Orders ?ED Discharge Orders   ? ? None  ? ?  ?An After Visit Summary was printed and given to the patient. ? ? ?  ?Bud Face, PA-C ?01/07/22 1804 ? ?  ?Lianne Cure, DO ?[id number] ? ?

## 2022-01-07 NOTE — Discharge Instructions (Signed)
As we discussed, your work-up in the ER today was reassuring for acute abnormalities. X-ray imaging did not reveal any fractures or dislocations of your knee. I have given you a knee immobilizer and crutches for same. Please call your orthopedist for further evaluation and management of your symptoms. In the interim, please take tylenol/ibuprofen for pain and rest, ice, compress, and elevate your knee as much as possible. ? ?Return if development of any new or worsening symptoms. ?

## 2022-01-07 NOTE — ED Triage Notes (Signed)
Patient here POV from Work. ? ?Endorses being at Work today in [employer] when the Floor gave way. Felt "Pop" on Left Knee upon falling. ? ?No Head Injury. No neck Injury.  ? ?NAD Noted during Triage. BIB Wheelchair. A&Ox4. GCS 15.  ?

## 2022-01-07 NOTE — ED Provider Triage Note (Signed)
Emergency Medicine Provider Triage Evaluation Note ? ?Cody Powell , a 36 y.o. male  was evaluated in triage.  Pt complains of left knee injury. States that earlier today at work he stepped through sheet rock and twisted his left knee and felt a pop. States that he has a history of several left knee surgeries in the past with the last time being approximately 2 years ago. Denies hitting his head or any loss of consciousness. States that he has been able to walk since the event with some pain. ? ?Review of Systems  ?Positive:  ?Negative:  ? ?Physical Exam  ?BP 130/85 (BP Location: Right Arm)   Pulse 61   Temp 97.8 ?F (36.6 ?C)   Resp 16   SpO2 98%  ?Gen:   Awake, no distress   ?Resp:  Normal effort  ?MSK:   Moves extremities without difficulty  ?Other:  Swelling and tenderness noted to the anterior left knee without obvious deformity. DP and PT pulses intact and 2+ ? ?Medical Decision Making  ?Medically screening exam initiated at 4:01 PM.  Appropriate orders placed.  Cody Powell was informed that the remainder of the evaluation will be completed by another provider, this initial triage assessment does not replace that evaluation, and the importance of remaining in the ED until their evaluation is complete. ? ? ?  ?Cody Face, PA-C ?01/07/22 1608 ? ?

## 2022-05-09 ENCOUNTER — Encounter (HOSPITAL_BASED_OUTPATIENT_CLINIC_OR_DEPARTMENT_OTHER): Payer: Self-pay

## 2022-05-09 ENCOUNTER — Other Ambulatory Visit: Payer: Self-pay

## 2022-05-09 ENCOUNTER — Emergency Department (HOSPITAL_BASED_OUTPATIENT_CLINIC_OR_DEPARTMENT_OTHER)
Admission: EM | Admit: 2022-05-09 | Discharge: 2022-05-10 | Disposition: A | Discharge status: Home / Self Care | Payer: BLUE CROSS/BLUE SHIELD | Attending: Emergency Medicine | Admitting: Emergency Medicine

## 2022-05-09 DIAGNOSIS — T402X1A Poisoning by other opioids, accidental (unintentional), initial encounter: Secondary | ICD-10-CM | POA: Insufficient documentation

## 2022-05-09 DIAGNOSIS — T50901A Poisoning by unspecified drugs, medicaments and biological substances, accidental (unintentional), initial encounter: Secondary | ICD-10-CM

## 2022-05-09 LAB — BASIC METABOLIC PANEL
Anion gap: 8 (ref 5–15)
BUN: 23 mg/dL — ABNORMAL HIGH (ref 6–20)
CO2: 27 mmol/L (ref 22–32)
Calcium: 9.4 mg/dL (ref 8.9–10.3)
Chloride: 107 mmol/L (ref 98–111)
Creatinine, Ser: 0.8 mg/dL (ref 0.61–1.24)
GFR, Estimated: >60 mL/min (ref >60)
Glucose, Bld: 79 mg/dL (ref 70–99)
Potassium: 3.4 mmol/L — ABNORMAL LOW (ref 3.5–5.1)
Sodium: 142 mmol/L (ref 135–145)

## 2022-05-09 LAB — CBC
HCT: 41.4 % (ref 39.0–52.0)
Hemoglobin: 14.7 g/dL (ref 13.0–17.0)
MCH: 31.8 pg (ref 26.0–34.0)
MCHC: 35.5 g/dL (ref 30.0–36.0)
MCV: 89.6 fL (ref 80.0–100.0)
Platelets: 246 10*3/uL (ref 150–400)
RBC: 4.62 MIL/uL (ref 4.22–5.81)
RDW: 14.6 % (ref 11.5–15.5)
WBC: 7 10*3/uL (ref 4.0–10.5)
nRBC: 0 % (ref 0.0–0.2)

## 2022-05-09 MED ORDER — ONDANSETRON HCL 4 MG/2ML IJ SOLN
4.0000 mg | Freq: Once | INTRAMUSCULAR | Status: AC
  Administered 2022-05-09: 4 mg via INTRAVENOUS

## 2022-05-09 MED ORDER — SODIUM CHLORIDE 0.9 % IV BOLUS
1000.0000 mL | Freq: Once | INTRAVENOUS | Status: AC
Start: 2022-05-09 — End: 2022-05-09
  Administered 2022-05-09: 1000 mL via INTRAVENOUS

## 2022-05-09 MED ORDER — ONDANSETRON HCL 4 MG/2ML IJ SOLN
4.0000 mg | Freq: Once | INTRAMUSCULAR | Status: AC
  Administered 2022-05-09: 4 mg via INTRAVENOUS
  Filled 2022-05-09: qty 2

## 2022-05-09 NOTE — ED Notes (Signed)
Late entry -- Pt awake and alert; GCS 15.  No acute distress noted.  Cardiac monitoring initiated - SB 55 to NSR low 60s on monitor.  Pt reports nausea with dizziness when turning head or eyes however symptoms have mostly improved- pt denies abd pain and ear ringing; no longer shaking or feeling weak.  Pt concerned that his HR is intermittently bradycardic -- Dr. Charm Barges notified; no new orders - will continue to monitor.

## 2022-05-09 NOTE — Discharge Instructions (Signed)
You were seen in the emergency department after an accidental overdose of a substance.  You had blood work and an EKG and were observed here for repair time.  Please rest and stay well-hydrated.  Follow-up with your regular doctor before you restart this substance.  Return to the emergency department for any worsening or concerning symptoms.

## 2022-05-09 NOTE — ED Provider Notes (Signed)
[facility] EMERGENCY DEPT Provider Note   CSN: [id number] Arrival date & time: 05/09/22  2047     History  Chief Complaint  Patient presents with   Drug Overdose    Cody Powell is a 36 y.o. male.  He accidentally took a larger dose of kratom than he usually ingests around 730 tonight.  About 30 minutes later he felt very shaky and unsteady associated with nausea.  He said his symptoms are somewhat improved now.  No chest pain or shortness of breath.  He denies it was to harm himself, uses this substance for energy and a general good feeling.  The history is provided by the patient.  Drug Overdose This is a new problem. The current episode started 3 to 5 hours ago. The problem has been gradually improving. Pertinent negatives include no chest pain, no abdominal pain, no headaches and no shortness of breath. Nothing aggravates the symptoms. Nothing relieves the symptoms. He has tried rest for the symptoms. The treatment provided mild relief.       Home Medications Prior to Admission medications   Medication Sig Start Date End Date Taking? Authorizing Provider  gabapentin (NEURONTIN) 100 MG capsule Take 100 mg by mouth 2 (two) times daily.    [provider]  testosterone cypionate (DEPOTESTOTERONE CYPIONATE) 100 MG/ML injection Inject into the muscle every 14 (fourteen) days. For IM use only    [provider]  ZUBSOLV 5.7-1.4 MG SUBL DISSOLVE ONE TABLET UNDER THE TONGUE 2 TIMES DAILY 11/23/15   [provider]      Allergies    Patient has no known allergies.    Review of Systems   Review of Systems  Constitutional:  Negative for fever.  Eyes:  Positive for visual disturbance.  Respiratory:  Negative for shortness of breath.   Cardiovascular:  Negative for chest pain.  Gastrointestinal:  Positive for nausea and vomiting. Negative for abdominal pain.  Neurological:  Positive for dizziness and tremors. Negative for headaches.     Physical Exam Updated Vital Signs BP (!) 141/98 (BP Location: Left Arm)   Pulse 81   Temp 98.5 F (36.9 C)   Resp (!) 22   Ht 6' (1.829 m)   Wt 102.2 kg   SpO2 100%   BMI 30.56 kg/m  Physical Exam Vitals and nursing note reviewed.  Constitutional:      General: He is not in acute distress.    Appearance: Normal appearance. He is well-developed.  HENT:     Head: Normocephalic and atraumatic.  Eyes:     Conjunctiva/sclera: Conjunctivae normal.  Cardiovascular:     Rate and Rhythm: Normal rate and regular rhythm.     Heart sounds: No murmur heard. Pulmonary:     Effort: Pulmonary effort is normal. No respiratory distress.     Breath sounds: Normal breath sounds.  Abdominal:     Palpations: Abdomen is soft.     Tenderness: There is no abdominal tenderness.  Musculoskeletal:        General: No swelling.     Cervical back: Neck supple.  Skin:    General: Skin is warm and dry.     Capillary Refill: Capillary refill takes less than 2 seconds.  Neurological:     General: No focal deficit present.     Mental Status: He is alert.     Sensory: No sensory deficit.     Motor: No weakness.     Gait: Gait normal.     ED  Results / Procedures / Treatments   Labs (all labs ordered are listed, but only abnormal results are displayed) Labs Reviewed  BASIC METABOLIC PANEL - Abnormal; Notable for the following components:      Result Value   Potassium 3.4 (*)    BUN 23 (*)    All other components within normal limits  CBC    EKG EKG Interpretation  Date/Time:  Thursday May 09 2022 21:20:24 EDT Ventricular Rate:  77 PR Interval:  230 QRS Duration: 126 QT Interval:  398 QTC Calculation: 450 R Axis:   233 Text Interpretation: Sinus rhythm with 1st degree A-V block Right superior axis deviation Non-specific intra-ventricular conduction block Cannot rule out Anterior infarct (cited on or before 24-Sep-2018) Abnormal ECG When compared with ECG of 24-Sep-2018 13:15, PR  interval has increased Left posterior fascicular block is no longer Present Confirmed by Meridee Score [phone]) on 05/09/2022 9:35:13 PM  Radiology No results found.  Procedures Procedures    Medications Ordered in ED Medications  sodium chloride 0.9 % bolus 1,000 mL (has no administration in time range)  ondansetron (ZOFRAN) injection 4 mg (has no administration in time range)  ondansetron (ZOFRAN) injection 4 mg (4 mg Intravenous Given 05/09/22 2125)    ED Course/ Medical Decision Making/ A&P                           Medical Decision Making Amount and/or Complexity of Data Reviewed Labs: ordered.  Risk Prescription drug management.  This patient complains of accidentally taking too much medication; this involves an extensive number of treatment Options and is a complaint that carries with it a high risk of complications and morbidity. The differential includes overdose accidental, self-harm, intoxication, arrhythmia, metabolic derangement  I ordered, reviewed and interpreted labs, which included CBC normal, chemistries normal other than mildly low potassium I ordered medication IV fluids and Zofran and reviewed PMP when indicated.  Additional history obtained from patient significant other Previous records obtained and reviewed in epic no recent admissions  Cardiac monitoring reviewed, normal sinus rhythm Social determinants considered, no significant barriers Critical Interventions: None  After the interventions stated above, I reevaluated the patient and found symptomatically improving Admission and further testing considered, patient's care signed out to Dr. Bernette Mayers to reassess after repeat EKG and continue cardiac monitoring.  Likely can be discharged soon for outpatient follow-up if no progressive symptoms.          Final Clinical Impression(s) / ED Diagnoses Final diagnoses:  Accidental overdose, initial encounter    Rx / DC Orders ED Discharge Orders      None         Terrilee Files, MD 05/10/22 [phone]

## 2022-05-09 NOTE — ED Triage Notes (Addendum)
Patient here POV from Home.  Endorses more recent History of taking Kratom over the Past Few Months for Energy. States he accidentally took 10 Times the Dose approximately at 1930.   Nausea. Emesis. Some ABD Pain. Patient does state he did take Nasal Narcan but Patient was not obtunded or unconscious when this occurred and only took narcan to see if effects from Overdose would wear off.  Uncomfortable during Triage. A&Ox4. GCS 15. BIB Wheelchair.

## 2022-05-10 NOTE — ED Provider Notes (Signed)
Care of the patient assumed at the change of shift. Took too much kratom. Repeat EKG unchanged, clear to discharge home.    Pollyann Savoy, MD 05/10/22 [phone]

## 2022-08-21 ENCOUNTER — Other Ambulatory Visit: Payer: Self-pay

## 2022-08-21 ENCOUNTER — Encounter (HOSPITAL_COMMUNITY): Payer: Self-pay | Admitting: *Deleted

## 2022-08-21 ENCOUNTER — Emergency Department (HOSPITAL_COMMUNITY): Payer: BLUE CROSS/BLUE SHIELD

## 2022-08-21 ENCOUNTER — Emergency Department (HOSPITAL_COMMUNITY)
Admission: EM | Admit: 2022-08-21 | Discharge: 2022-08-22 | Disposition: A | Discharge status: Home / Self Care | Payer: BLUE CROSS/BLUE SHIELD | Attending: Emergency Medicine | Admitting: Emergency Medicine

## 2022-08-21 DIAGNOSIS — I10 Essential (primary) hypertension: Secondary | ICD-10-CM | POA: Diagnosis not present

## 2022-08-21 DIAGNOSIS — R748 Abnormal levels of other serum enzymes: Secondary | ICD-10-CM | POA: Insufficient documentation

## 2022-08-21 DIAGNOSIS — Z87891 Personal history of nicotine dependence: Secondary | ICD-10-CM | POA: Insufficient documentation

## 2022-08-21 DIAGNOSIS — R072 Precordial pain: Secondary | ICD-10-CM | POA: Diagnosis present

## 2022-08-21 DIAGNOSIS — R0789 Other chest pain: Secondary | ICD-10-CM | POA: Insufficient documentation

## 2022-08-21 DIAGNOSIS — E119 Type 2 diabetes mellitus without complications: Secondary | ICD-10-CM | POA: Insufficient documentation

## 2022-08-21 LAB — CBC WITH DIFFERENTIAL/PLATELET
Abs Immature Granulocytes: 0.01 10*3/uL (ref 0.00–0.07)
Basophils Absolute: 0.1 10*3/uL (ref 0.0–0.1)
Basophils Relative: 2 %
Eosinophils Absolute: 0.2 10*3/uL (ref 0.0–0.5)
Eosinophils Relative: 4 %
HCT: 37.1 % — ABNORMAL LOW (ref 39.0–52.0)
Hemoglobin: 13.5 g/dL (ref 13.0–17.0)
Immature Granulocytes: 0 %
Lymphocytes Relative: 42 %
Lymphs Abs: 1.9 10*3/uL (ref 0.7–4.0)
MCH: 32.1 pg (ref 26.0–34.0)
MCHC: 36.4 g/dL — ABNORMAL HIGH (ref 30.0–36.0)
MCV: 88.1 fL (ref 80.0–100.0)
Monocytes Absolute: 0.4 10*3/uL (ref 0.1–1.0)
Monocytes Relative: 8 %
Neutro Abs: 2 10*3/uL (ref 1.7–7.7)
Neutrophils Relative %: 44 %
Platelets: 221 10*3/uL (ref 150–400)
RBC: 4.21 MIL/uL — ABNORMAL LOW (ref 4.22–5.81)
RDW: 13.3 % (ref 11.5–15.5)
WBC: 4.5 10*3/uL (ref 4.0–10.5)
nRBC: 0 % (ref 0.0–0.2)

## 2022-08-21 NOTE — ED Triage Notes (Signed)
The pt had his gb removed  one week ago   chest pain today  some sob

## 2022-08-22 ENCOUNTER — Emergency Department (HOSPITAL_COMMUNITY): Payer: BLUE CROSS/BLUE SHIELD

## 2022-08-22 LAB — TROPONIN I (HIGH SENSITIVITY)
Troponin I (High Sensitivity): 3 ng/L (ref <18)
Troponin I (High Sensitivity): 3 ng/L (ref <18)

## 2022-08-22 LAB — COMPREHENSIVE METABOLIC PANEL
ALT: 225 U/L — ABNORMAL HIGH (ref 0–44)
AST: 167 U/L — ABNORMAL HIGH (ref 15–41)
Albumin: 4 g/dL (ref 3.5–5.0)
Alkaline Phosphatase: 116 U/L (ref 38–126)
Anion gap: 8 (ref 5–15)
BUN: 18 mg/dL (ref 6–20)
CO2: 28 mmol/L (ref 22–32)
Calcium: 9 mg/dL (ref 8.9–10.3)
Chloride: 103 mmol/L (ref 98–111)
Creatinine, Ser: 0.8 mg/dL (ref 0.61–1.24)
GFR, Estimated: >60 mL/min (ref >60)
Glucose, Bld: 69 mg/dL — ABNORMAL LOW (ref 70–99)
Potassium: 4.2 mmol/L (ref 3.5–5.1)
Sodium: 139 mmol/L (ref 135–145)
Total Bilirubin: 0.5 mg/dL (ref 0.3–1.2)
Total Protein: 6.4 g/dL — ABNORMAL LOW (ref 6.5–8.1)

## 2022-08-22 LAB — LIPASE, BLOOD: Lipase: 37 U/L (ref 11–51)

## 2022-08-22 MED ORDER — IOHEXOL 350 MG/ML SOLN
100.0000 mL | Freq: Once | INTRAVENOUS | Status: AC | PRN
  Administered 2022-08-22: 100 mL via INTRAVENOUS

## 2022-08-22 MED ORDER — ONDANSETRON HCL 4 MG/2ML IJ SOLN
4.0000 mg | Freq: Once | INTRAMUSCULAR | Status: AC
  Administered 2022-08-22: 4 mg via INTRAVENOUS
  Filled 2022-08-22: qty 2

## 2022-08-22 MED ORDER — HYDROMORPHONE HCL 1 MG/ML IJ SOLN
1.0000 mg | Freq: Once | INTRAMUSCULAR | Status: AC
  Administered 2022-08-22: 1 mg via INTRAVENOUS
  Filled 2022-08-22: qty 1

## 2022-08-22 MED ORDER — ALUM & MAG HYDROXIDE-SIMETH 200-200-20 MG/5ML PO SUSP
30.0000 mL | Freq: Once | ORAL | Status: AC
  Administered 2022-08-22: 30 mL via ORAL
  Filled 2022-08-22: qty 30

## 2022-08-22 MED ORDER — KETOROLAC TROMETHAMINE 30 MG/ML IJ SOLN
30.0000 mg | Freq: Once | INTRAMUSCULAR | Status: AC
  Administered 2022-08-22: 30 mg via INTRAVENOUS
  Filled 2022-08-22: qty 1

## 2022-08-22 MED ORDER — OMEPRAZOLE 20 MG PO CPDR: 20.0000 mg | DELAYED_RELEASE_CAPSULE | Freq: Every day | ORAL | 0 refills | Status: AC

## 2022-08-22 NOTE — ED Provider Notes (Signed)
[facility] Provider [facility]   CSN: [id number] Arrival date & time: 08/21/22  2124     History  Chief Complaint  Patient presents with   Chest Pain    Cody Powell is a 36 y.o. male.  HPI   Medical history including polysubstance use presents to the emergency department complaints of midsternal chest pain, started at 2 PM today, states it was intermittent, total duration of chest pain last about an hour, currently has none at this time, he states during his chest pain he was having shortness of breath but currently has none at this time, denies hemoptysis, no recent URIs, he has no history of PEs or DVTs, he is currently getting testosterone shots, he recently had his gallbladder removed on the 20th, he denies any stomach pains nausea vomiting diarrhea denies bloody stools or melena, denies any fevers chills or worsening infection around his incision sites.  He has no cardiac history, he is a former smoker, he is not being treated for diabetes hyperlipidemia or hypertension.  Reviewed patient's chart had [facility], uncomplicated, and was discharged home.  Home Medications Prior to Admission medications   Medication Sig Start Date End Date Taking? Authorizing Provider  omeprazole (PRILOSEC) 20 MG capsule Take 1 capsule (20 mg total) by mouth daily. 08/22/22 09/21/22 Yes Carroll SageFaulkner, Parul Porcelli J, PA-C  gabapentin (NEURONTIN) 100 MG capsule Take 100 mg by mouth 2 (two) times daily.    [provider]  testosterone cypionate (DEPOTESTOTERONE CYPIONATE) 100 MG/ML injection Inject into the muscle every 14 (fourteen) days. For IM use only    [provider]  ZUBSOLV 5.7-1.4 MG SUBL DISSOLVE ONE TABLET UNDER THE TONGUE 2 TIMES DAILY 11/23/15   [provider]      Allergies    Patient has no known allergies.    Review of Systems   Review of Systems  Constitutional:  Negative for chills and  fever.  Respiratory:  Negative for shortness of breath.   Cardiovascular:  Positive for chest pain.  Gastrointestinal:  Negative for abdominal pain.  Neurological:  Negative for headaches.    Physical Exam Updated Vital Signs BP 128/83   Pulse (!) 56   Temp 97.8 F (36.6 C) (Oral)   Resp (!) 9   Ht 6' (1.829 m)   Wt 102.2 kg   SpO2 98%   BMI 30.56 kg/m  Physical Exam Vitals and nursing note reviewed.  Constitutional:      General: He is not in acute distress.    Appearance: He is not ill-appearing.  HENT:     Head: Normocephalic and atraumatic.     Nose: No congestion.  Eyes:     Conjunctiva/sclera: Conjunctivae normal.  Cardiovascular:     Rate and Rhythm: Normal rate and regular rhythm.     Pulses: Normal pulses.     Heart sounds: No murmur heard.    No friction rub. No gallop.  Pulmonary:     Effort: No respiratory distress.     Breath sounds: No wheezing, rhonchi or rales.  Abdominal:     Palpations: Abdomen is soft.     Tenderness: There is no abdominal tenderness. There is no right CVA tenderness or left CVA tenderness.     Comments: Abdomen nondistended, surgical scars present, stented sites were closed, there is slight erythema around each incision site but no drainage or discharge no fluctuance present abdomen was soft nontender.  Musculoskeletal:  Right lower leg: No edema.     Left lower leg: No edema.     Comments: No unilateral leg swelling no calf tenderness.  Skin:    General: Skin is warm and dry.  Neurological:     Mental Status: He is alert.  Psychiatric:        Mood and Affect: Mood normal.     ED Results / Procedures / Treatments   Labs (all labs ordered are listed, but only abnormal results are displayed) Labs Reviewed  COMPREHENSIVE METABOLIC PANEL - Abnormal; Notable for the following components:      Result Value   Glucose, Bld 69 (*)    Total Protein 6.4 (*)    AST 167 (*)    ALT 225 (*)    All other components within normal  limits  CBC WITH DIFFERENTIAL/PLATELET - Abnormal; Notable for the following components:   RBC 4.21 (*)    HCT 37.1 (*)    MCHC 36.4 (*)    All other components within normal limits  LIPASE, BLOOD  TROPONIN I (HIGH SENSITIVITY)  TROPONIN I (HIGH SENSITIVITY)    EKG EKG Interpretation  Date/Time:  Thursday August 22 2022 01:11:50 EST Ventricular Rate:  62 PR Interval:  190 QRS Duration: 115 QT Interval:  409 QTC Calculation: 416 R Axis:   151 Text Interpretation: Sinus rhythm Left posterior fascicular block Borderline low voltage, extremity leads ST elev, probable normal early repol pattern Confirmed by Kennis Carina [id number]) on 08/22/2022 1:15:36 AM  Radiology CT Angio Chest/Abd/Pel for Dissection W and/or Wo Contrast  Result Date: 08/22/2022 CLINICAL DATA:  Recent cholecystectomy with chest pain and shortness of breath EXAM: CT ANGIOGRAPHY CHEST, ABDOMEN AND PELVIS TECHNIQUE: Non-contrast CT of the chest was initially obtained. Multidetector CT imaging through the chest, abdomen and pelvis was performed using the standard protocol during bolus administration of intravenous contrast. Multiplanar reconstructed images and MIPs were obtained and reviewed to evaluate the vascular anatomy. RADIATION DOSE REDUCTION: This exam was performed according to the departmental dose-optimization program which includes automated exposure control, adjustment of the mA and/or kV according to patient size and/or use of iterative reconstruction technique. CONTRAST:  OMNIPAQUE IOHEXOL 350 MG/ML SOLN COMPARISON:  None Available. FINDINGS: CTA CHEST FINDINGS Cardiovascular: Initial precontrast images demonstrate no hyperdense crescent to suggest acute aortic injury. The thoracic aorta shows no aneurysmal dilatation or dissection. Heart is at the upper limits of normal in size. No coronary calcifications are noted. The pulmonary artery as visualized is within normal limits. No focal infiltrate is noted to  suggest pulmonary embolism. Multiple collaterals are noted in the region of the right shoulder Mediastinum/Nodes: Thoracic inlet is within normal limits. No hilar or mediastinal adenopathy is noted. The esophagus is within normal limits. Lungs/Pleura: Lungs are well aerated bilaterally. No focal infiltrate or effusion is seen. Musculoskeletal: No acute bony abnormality is noted. Review of the MIP images confirms the above findings. CTA ABDOMEN AND PELVIS FINDINGS VASCULAR Aorta: Abdominal aorta shows no aneurysmal dilatation or dissection. Celiac: Patent without evidence of aneurysm, dissection, vasculitis or significant stenosis. SMA: Patent without evidence of aneurysm, dissection, vasculitis or significant stenosis. 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 Single renal artery on the left. No focal stenosis is noted IMA: Patent without evidence of aneurysm, dissection, vasculitis or significant stenosis. Inflow: Iliacs are within normal limits. Veins: No specific venous abnormality is noted. Review of the MIP images confirms the above findings. NON-VASCULAR Hepatobiliary: No focal liver abnormality is seen.  Status post cholecystectomy. No biliary dilatation. Pancreas: Unremarkable. No pancreatic ductal dilatation or surrounding inflammatory changes. Spleen: Normal in size without focal abnormality. Adrenals/Urinary Tract: Adrenal glands are within normal limits. Kidneys demonstrate a normal enhancement pattern bilaterally. No renal calculi or obstructive changes are seen. The bladder is well distended. Stomach/Bowel: Scattered fecal material is noted throughout the colon. No obstructive or inflammatory changes are seen. The appendix is within normal limits. Small bowel is within normal limits. 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 Lymphatic: No sizable adenopathy is noted. Reproductive: Prostate is unremarkable. Other: No abdominal wall hernia or abnormality. No  abdominopelvic ascites. Musculoskeletal: No acute or significant osseous findings. Review of the MIP images confirms the above findings. IMPRESSION: No evidence of aortic dissection or aneurysmal dilatation. No pulmonary emboli are noted. No acute abnormality is noted in the chest, abdomen and pelvis to correspond with the given clinical history. Electronically Signed   By: Alcide Clever M.D.   On: 08/22/2022 01:53   DG Chest 2 View  Result Date: 08/21/2022 CLINICAL DATA:  Chest pain. EXAM: CHEST - 2 VIEW COMPARISON:  September 24, 2018 FINDINGS: The heart size and mediastinal contours are within normal limits. Both lungs are clear. Multiple radiopaque surgical clips are seen within the anterior medial aspect of the left upper quadrant. The visualized skeletal structures are unremarkable. IMPRESSION: No active cardiopulmonary disease. Electronically Signed   By: Aram Candela M.D.   On: 08/21/2022 22:44    Procedures Procedures    Medications Ordered in ED Medications  HYDROmorphone (DILAUDID) injection 1 mg (1 mg Intravenous Given 08/22/22 0101)  ondansetron (ZOFRAN) injection 4 mg (4 mg Intravenous Given 08/22/22 0100)  iohexol (OMNIPAQUE) 350 MG/ML injection 100 mL (100 mLs Intravenous Contrast Given 08/22/22 0139)  ketorolac (TORADOL) 30 MG/ML injection 30 mg (30 mg Intravenous Given 08/22/22 0322)  alum & mag hydroxide-simeth (MAALOX/MYLANTA) 200-200-20 MG/5ML suspension 30 mL (30 mLs Oral Given 08/22/22 0322)    ED Course/ Medical Decision Making/ A&P                           Medical Decision Making Amount and/or Complexity of Data Reviewed Labs: ordered. Radiology: ordered.  Risk OTC drugs. Prescription drug management.   This patient presents to the ED for concern of chest pain, this involves an extensive number of treatment options, and is a complaint that carries with it a high risk of complications and morbidity.  The differential diagnosis includes PE, ACS,  pneumonia    Additional history obtained:  Additional history obtained from N/A External records from outside source obtained and reviewed including recent surgical notes   Co morbidities that complicate the patient evaluation  Recent cholecystectomy  Social Determinants of Health:  N/A    Lab Tests:  I Ordered, and personally interpreted labs.  The pertinent results include: CBC is unremarkable, CMP shows glucose of 69, total protein 6 4, AST 167 ALT 225, lipase 37, for troponin is 3   Imaging Studies ordered:  I ordered imaging studies including chest x-ray, CTA chest pelvis I independently visualized and interpreted imaging which showed chest x-ray is negative, I agree with the radiologist interpretation   Cardiac Monitoring:  The patient was maintained on a cardiac monitor.  I personally viewed and interpreted the cardiac monitored which showed an underlying rhythm of: Without signs of ischemia   Medicines ordered and prescription drug management:  I ordered medication including N/A I have reviewed  the patients home medicines and have made adjustments as needed  Critical Interventions:  N/A   Reevaluation:  Triage obtain lab work imaging which I personally reviewed, patient has elevation in his liver enzymes, in comparison to his liver enzymes from the 20th Ast 49 and Alt was 98 today his AST is 167 ALT is 225, his abdomen soft nontender, likely this is from postop surgery but cannot exclude the possibility of a stone in the common bile duct will obtain imaging, I am also concerned for possible PE as recent surgery, will send patient down for CTA chest abdomen pelvis for further evaluation.    Consultations Obtained:  N/A    Test Considered:  N/A    Rule out I have low suspicion for ACS as history is atypical, patient has no cardiac history, EKG was sinus rhythm without signs of ischemia, patient had a negative troponin.  Low suspicion PE or  dissection is also low as CT imaging is negative.  Suspicion for choledocholithiasis or cholangitis is also low at this time as there is no ductal dilation, his abdomen is soft nontender.  I doubt pancreatitis as lipase is within normal limits.  Suspicion for intra-abdominal infection is also low at this time as he has a nonsurgical abdomen, CT imaging negative acute findings.  His liver enzymes are slightly elevated I suspect this is from his previous cholecystectomy.  It is noted that patient has some erythema around his and surgical sites I doubt these are infectious, as it is at all surgical sites, states they were extremely itchy from the glue, this is more consistent with contact dermatitis.    Dispostion and problem list  After consideration of the diagnostic results and the patients response to treatment, I feel that the patent would benefit from DC.  Chest pain-suspect this is more muscular in nature, possible costochondritis, will have him take NSAIDs, follow-up with his PCP for further evaluation Elevated liver enzymes-suspect secondary due to postsurgery, will have him follow-up with his general surgeon for further evaluation.            Final Clinical Impression(s) / ED Diagnoses Final diagnoses:  Atypical chest pain  Elevated liver enzymes    Rx / DC Orders ED Discharge Orders          Ordered    omeprazole (PRILOSEC) 20 MG capsule  Daily        08/22/22 0428              Carroll Sage, PA-C 08/22/22 0429    Sabas Sous, MD 08/22/22 [phone]

## 2022-08-22 NOTE — Discharge Instructions (Addendum)
Chest pain suspect this is more inflammation of the muscles within your chest, would like you to take Tylenol, I have also given you a acid pill please take as prescribed.  I would like to follow-up with your PCP for reassessment. Elevated liver enzymes-please follow-up with your general surgeon for further evaluation.  Come back to the emergency department if you develop chest pain, shortness of breath, severe abdominal pain, uncontrolled nausea, vomiting, diarrhea.

## 2022-09-11 ENCOUNTER — Ambulatory Visit: Payer: PRIVATE HEALTH INSURANCE | Admitting: Gastroenterology
